# Patient Record
Sex: Female | Born: 1990 | Race: Black or African American | Hispanic: No | Marital: Single | Smoking: Never smoker
Health system: Southern US, Community
[De-identification: ages and names within clinical notes are randomized; demographics above are authoritative.]

## PROBLEM LIST (undated history)

## (undated) ENCOUNTER — Inpatient Hospital Stay (HOSPITAL_COMMUNITY): Payer: Self-pay

## (undated) DIAGNOSIS — B999 Unspecified infectious disease: Secondary | ICD-10-CM

## (undated) DIAGNOSIS — IMO0002 Reserved for concepts with insufficient information to code with codable children: Secondary | ICD-10-CM

## (undated) DIAGNOSIS — D219 Benign neoplasm of connective and other soft tissue, unspecified: Secondary | ICD-10-CM

## (undated) DIAGNOSIS — J45909 Unspecified asthma, uncomplicated: Secondary | ICD-10-CM

## (undated) DIAGNOSIS — R87619 Unspecified abnormal cytological findings in specimens from cervix uteri: Secondary | ICD-10-CM

## (undated) DIAGNOSIS — A539 Syphilis, unspecified: Secondary | ICD-10-CM

## (undated) DIAGNOSIS — D649 Anemia, unspecified: Secondary | ICD-10-CM

## (undated) DIAGNOSIS — A749 Chlamydial infection, unspecified: Secondary | ICD-10-CM

## (undated) DIAGNOSIS — B009 Herpesviral infection, unspecified: Secondary | ICD-10-CM

## (undated) HISTORY — DX: Herpesviral infection, unspecified: B00.9

## (undated) HISTORY — DX: Anemia, unspecified: D64.9

## (undated) HISTORY — DX: Benign neoplasm of connective and other soft tissue, unspecified: D21.9

## (undated) HISTORY — DX: Chlamydial infection, unspecified: A74.9

## (undated) HISTORY — DX: Syphilis, unspecified: A53.9

## (undated) HISTORY — PX: NO PAST SURGERIES: SHX2092

## (undated) SURGERY — Surgical Case
Anesthesia: *Unknown

---
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
## 2012-11-20 ENCOUNTER — Inpatient Hospital Stay (HOSPITAL_COMMUNITY)
Admission: AD | Admit: 2012-11-20 | Discharge: 2012-11-20 | Disposition: A | Discharge status: Home / Self Care | Payer: Medicaid Other | Source: Ambulatory Visit | Attending: Obstetrics & Gynecology | Admitting: Obstetrics & Gynecology

## 2012-11-20 ENCOUNTER — Encounter (HOSPITAL_COMMUNITY): Payer: Self-pay | Admitting: Medical

## 2012-11-20 DIAGNOSIS — Z3201 Encounter for pregnancy test, result positive: Secondary | ICD-10-CM | POA: Insufficient documentation

## 2012-11-20 DIAGNOSIS — N912 Amenorrhea, unspecified: Secondary | ICD-10-CM | POA: Insufficient documentation

## 2012-11-20 DIAGNOSIS — R109 Unspecified abdominal pain: Secondary | ICD-10-CM | POA: Insufficient documentation

## 2012-11-20 NOTE — MAU Provider Note (Signed)
Jane Rivers is a 22 y.o. G1P0 at [redacted]w[redacted]d who presents to MAU today with complaint of missed period. The patient states LMP 10/17/12. She denies abdominal pain, vaginal bleeding, discharge or other concerns at this time.   BP 118/71  Pulse 73  Temp(Src) 98.1 F (36.7 C) (Oral)  Resp 16  Ht 5\' 2"  (1.575 m)  Wt 164 lb 12.8 oz (74.753 kg)  BMI 30.13 kg/m2  SpO2 100%  LMP 10/17/2012 GENERAL: Well-developed, well-nourished female in no acute distress.  HEENT: Normocephalic, atraumatic.   LUNGS: Effort normal HEART: Regular rate  SKIN: Warm, dry and without erythema PSYCH: Normal mood and affect  Results for orders placed during the hospital encounter of 11/20/12 (from the past 24 hour(s))  POCT PREGNANCY, URINE     Status: Abnormal   Collection Time    11/20/12  3:35 PM      Result Value Range   Preg Test, Ur POSITIVE (*) NEGATIVE    A: Positive pregnancy test  P: Discharge home Pregnancy confirmation letter given with list of area providers and Medicaid information Bleeding/ectopic precautions discussed Patient may return to MAU as needed  Freddi Starr, PA-C 11/20/2012 3:42 PM

## 2012-11-20 NOTE — MAU Note (Signed)
Patient states she wants to have a pregnancy test because she has missed her period. Patient has not done a test at home. Denies any problems, pain, bleeding or discharge.

## 2012-11-20 NOTE — MAU Provider Note (Signed)
Attestation of Attending Supervision of Advanced Practitioner (CNM/NP): Evaluation and management procedures were performed by the Advanced Practitioner under my supervision and collaboration.  I have reviewed the Advanced Practitioner's note and chart, and I agree with the management and plan.  HARRAWAY-SMITH, Daisee Centner 5:38 PM     

## 2012-12-05 ENCOUNTER — Inpatient Hospital Stay (HOSPITAL_COMMUNITY): Payer: Medicaid Other

## 2012-12-05 ENCOUNTER — Encounter (HOSPITAL_COMMUNITY): Payer: Self-pay | Admitting: *Deleted

## 2012-12-05 ENCOUNTER — Inpatient Hospital Stay (HOSPITAL_COMMUNITY)
Admission: AD | Admit: 2012-12-05 | Discharge: 2012-12-05 | Disposition: A | Discharge status: Home / Self Care | Payer: Medicaid Other | Source: Ambulatory Visit | Attending: Family Medicine | Admitting: Family Medicine

## 2012-12-05 DIAGNOSIS — M549 Dorsalgia, unspecified: Secondary | ICD-10-CM | POA: Insufficient documentation

## 2012-12-05 DIAGNOSIS — O26899 Other specified pregnancy related conditions, unspecified trimester: Secondary | ICD-10-CM

## 2012-12-05 DIAGNOSIS — R109 Unspecified abdominal pain: Secondary | ICD-10-CM | POA: Insufficient documentation

## 2012-12-05 DIAGNOSIS — O99891 Other specified diseases and conditions complicating pregnancy: Secondary | ICD-10-CM | POA: Insufficient documentation

## 2012-12-05 HISTORY — DX: Unspecified abnormal cytological findings in specimens from cervix uteri: R87.619

## 2012-12-05 HISTORY — DX: Unspecified asthma, uncomplicated: J45.909

## 2012-12-05 HISTORY — DX: Reserved for concepts with insufficient information to code with codable children: IMO0002

## 2012-12-05 HISTORY — DX: Unspecified infectious disease: B99.9

## 2012-12-05 LAB — URINALYSIS, ROUTINE W REFLEX MICROSCOPIC
Bilirubin Urine: NEGATIVE
Protein, ur: NEGATIVE mg/dL
pH: 6 (ref 5.0–8.0)

## 2012-12-05 LAB — CBC
HCT: 38.4 % (ref 36.0–46.0)
Hemoglobin: 12.5 g/dL (ref 12.0–15.0)
MCH: 26.5 pg (ref 26.0–34.0)
MCHC: 32.6 g/dL (ref 30.0–36.0)
WBC: 9.6 10*3/uL (ref 4.0–10.5)

## 2012-12-05 LAB — HCG, QUANTITATIVE, PREGNANCY: hCG, Beta Chain, Quant, S: 91119 m[IU]/mL — ABNORMAL HIGH (ref <5)

## 2012-12-05 LAB — WET PREP, GENITAL: Trich, Wet Prep: NONE SEEN

## 2012-12-05 LAB — URINE MICROSCOPIC-ADD ON

## 2012-12-05 MED ORDER — PROMETHAZINE HCL 25 MG PO TABS: 25.0000 mg | ORAL_TABLET | Freq: Four times a day (QID) | ORAL | Status: DC | PRN

## 2012-12-05 NOTE — MAU Note (Signed)
abd and back pain, cramping. Started yesterday.

## 2012-12-05 NOTE — MAU Note (Signed)
Urine cloudy/murky

## 2012-12-05 NOTE — MAU Provider Note (Signed)
Chart reviewed and agree with management and plan.  

## 2012-12-05 NOTE — MAU Provider Note (Signed)
History     CSN: [id number]  Arrival date and time: 12/05/12 1216   First Provider Initiated Contact with Patient 12/05/12 1311      Chief Complaint  Patient presents with  . Abdominal Pain   HPI  Pt is [redacted]w[redacted]d pregnant and presents with abd pain that comes and goes.  Pt has nausea on and off.  Pt's pain is not associated with movement.  Pt denies spotting or bleeding or UTI symptoms.  Pt does state that she has a vaginal discharge. Pt has never had a pelvic exam before.   Past Medical History  Diagnosis Date  . Asthma   . Abnormal Pap smear     never had  . Infection     chlamydia    Past Surgical History  Procedure Laterality Date  . No past surgeries      Family History  Problem Relation Age of Onset  . Diabetes Maternal Aunt     History  Substance Use Topics  . Smoking status: Never Smoker   . Smokeless tobacco: Never Used  . Alcohol Use: No    Allergies:  Allergies  Allergen Reactions  . Shellfish Allergy Nausea And Vomiting    Prescriptions prior to admission  Medication Sig Dispense Refill  . Prenatal Vit-Fe Fumarate-FA (PRENATAL MULTIVITAMIN) TABS Take 1 tablet by mouth daily at 12 noon.        ROS Physical Exam   Blood pressure 121/67, pulse 77, temperature 97.2 F (36.2 C), temperature source Oral, resp. rate 18, height 5\' 1"  (1.549 m), weight 76.204 kg (168 lb), last menstrual period 10/17/2012.  Physical Exam  Nursing note and vitals reviewed. Constitutional: She is oriented to person, place, and time. She appears well-developed and well-nourished.  HENT:  Head: Normocephalic.  Eyes: Pupils are equal, round, and reactive to light.  Neck: Normal range of motion. Neck supple.  Cardiovascular: Normal rate.   Respiratory: Effort normal.  GI: Soft. She exhibits no distension. There is no tenderness. There is no rebound.  Genitourinary:  Reddened vaginal mucosa with mod amount of white-cream colored frothy dscharge; cervix clean; uterus NSSC-  6-8 week size; NT; adnexa without palpable enlargement or tenderness  Musculoskeletal: Normal range of motion.  Neurological: She is alert and oriented to person, place, and time.  Skin: Skin is warm and dry.  Psychiatric: She has a normal mood and affect.    MAU Course  Procedures Results for orders placed during the hospital encounter of 12/05/12 (from the past 24 hour(s))  CBC     Status: None   Collection Time    12/05/12 12:32 PM      Result Value Range   WBC 9.6  4.0 - 10.5 K/uL   RBC 4.72  3.87 - 5.11 MIL/uL   Hemoglobin 12.5  12.0 - 15.0 g/dL   HCT 11.9  14.7 - 82.9 %   MCV 81.4  78.0 - 100.0 fL   MCH 26.5  26.0 - 34.0 pg   MCHC 32.6  30.0 - 36.0 g/dL   RDW 56.2  13.0 - 86.5 %   Platelets 216  150 - 400 K/uL  HCG, QUANTITATIVE, PREGNANCY     Status: Abnormal   Collection Time    12/05/12 12:32 PM      Result Value Range   hCG, Beta Chain, Quant, S 91119 (*) <5 mIU/mL  URINALYSIS, ROUTINE W REFLEX MICROSCOPIC     Status: Abnormal   Collection Time    12/05/12 12:33 PM  Result Value Range   Color, Urine YELLOW  YELLOW   APPearance CLOUDY (*) CLEAR   Specific Gravity, Urine 1.025  1.005 - 1.030   pH 6.0  5.0 - 8.0   Glucose, UA NEGATIVE  NEGATIVE mg/dL   Hgb urine dipstick TRACE (*) NEGATIVE   Bilirubin Urine NEGATIVE  NEGATIVE   Ketones, ur NEGATIVE  NEGATIVE mg/dL   Protein, ur NEGATIVE  NEGATIVE mg/dL   Urobilinogen, UA 0.2  0.0 - 1.0 mg/dL   Nitrite NEGATIVE  NEGATIVE   Leukocytes, UA LARGE (*) NEGATIVE  URINE MICROSCOPIC-ADD ON     Status: Abnormal   Collection Time    12/05/12 12:33 PM      Result Value Range   Squamous Epithelial / LPF MANY (*) RARE   WBC, UA 21-50  <3 WBC/hpf   Bacteria, UA FEW (*) RARE  WET PREP, GENITAL     Status: Abnormal   Collection Time    12/05/12  1:21 PM      Result Value Range   Yeast Wet Prep HPF POC NONE SEEN  NONE SEEN   Trich, Wet Prep NONE SEEN  NONE SEEN   Clue Cells Wet Prep HPF POC NONE SEEN  NONE SEEN    WBC, Wet Prep HPF POC FEW (*) NONE SEEN    Assessment and Plan  Viable 7w IUP F/u with Prenatal care- pt plans to go to [facility] 12/05/2012, 2:24 PM

## 2012-12-06 LAB — URINE CULTURE: Colony Count: 80000

## 2012-12-06 LAB — GC/CHLAMYDIA PROBE AMP: GC Probe RNA: NEGATIVE

## 2013-01-29 ENCOUNTER — Encounter: Payer: Self-pay | Admitting: Obstetrics & Gynecology

## 2013-04-29 ENCOUNTER — Inpatient Hospital Stay (HOSPITAL_COMMUNITY)
Admission: AD | Admit: 2013-04-29 | Discharge: 2013-04-29 | Disposition: A | Discharge status: Home / Self Care | Payer: Medicaid Other | Source: Ambulatory Visit | Attending: Obstetrics and Gynecology | Admitting: Obstetrics and Gynecology

## 2013-04-29 ENCOUNTER — Encounter (HOSPITAL_COMMUNITY): Payer: Self-pay

## 2013-04-29 ENCOUNTER — Inpatient Hospital Stay (HOSPITAL_COMMUNITY): Payer: Medicaid Other

## 2013-04-29 DIAGNOSIS — O99891 Other specified diseases and conditions complicating pregnancy: Secondary | ICD-10-CM | POA: Insufficient documentation

## 2013-04-29 DIAGNOSIS — R109 Unspecified abdominal pain: Secondary | ICD-10-CM | POA: Insufficient documentation

## 2013-04-29 DIAGNOSIS — Z22330 Carrier of Group B streptococcus: Secondary | ICD-10-CM | POA: Insufficient documentation

## 2013-04-29 DIAGNOSIS — O4700 False labor before 37 completed weeks of gestation, unspecified trimester: Secondary | ICD-10-CM | POA: Insufficient documentation

## 2013-04-29 DIAGNOSIS — R079 Chest pain, unspecified: Secondary | ICD-10-CM | POA: Insufficient documentation

## 2013-04-29 LAB — CBC WITH DIFFERENTIAL/PLATELET
Basophils Absolute: 0 10*3/uL (ref 0.0–0.1)
Basophils Relative: 0 % (ref 0–1)
Eosinophils Absolute: 0.3 10*3/uL (ref 0.0–0.7)
Eosinophils Relative: 4 % (ref 0–5)
MCH: 26.7 pg (ref 26.0–34.0)
MCHC: 32.9 g/dL (ref 30.0–36.0)
Monocytes Absolute: 0.6 10*3/uL (ref 0.1–1.0)
Monocytes Relative: 8 % (ref 3–12)
Neutrophils Relative %: 71 % (ref 43–77)
Platelets: 172 10*3/uL (ref 150–400)
RDW: 14.5 % (ref 11.5–15.5)
WBC: 8.4 10*3/uL (ref 4.0–10.5)

## 2013-04-29 LAB — WET PREP, GENITAL
Clue Cells Wet Prep HPF POC: NONE SEEN
Trich, Wet Prep: NONE SEEN
Yeast Wet Prep HPF POC: NONE SEEN

## 2013-04-29 LAB — BASIC METABOLIC PANEL
BUN: 6 mg/dL (ref 6–23)
Calcium: 9.3 mg/dL (ref 8.4–10.5)
GFR calc Af Amer: >90 mL/min (ref >90)
GFR calc non Af Amer: >90 mL/min (ref >90)
Potassium: 3.8 mEq/L (ref 3.5–5.1)
Sodium: 131 mEq/L — ABNORMAL LOW (ref 135–145)

## 2013-04-29 LAB — URINALYSIS, ROUTINE W REFLEX MICROSCOPIC
Bilirubin Urine: NEGATIVE
Glucose, UA: NEGATIVE mg/dL
Ketones, ur: NEGATIVE mg/dL
Nitrite: NEGATIVE
Protein, ur: NEGATIVE mg/dL
Specific Gravity, Urine: >1.03 — ABNORMAL HIGH (ref 1.005–1.030)
Urobilinogen, UA: 0.2 mg/dL (ref 0.0–1.0)

## 2013-04-29 LAB — FETAL FIBRONECTIN: Fetal Fibronectin: NEGATIVE

## 2013-04-29 MED ORDER — NIFEDIPINE 10 MG PO CAPS: 10.0000 mg | ORAL_CAPSULE | ORAL | Status: DC | PRN

## 2013-04-29 MED ORDER — NIFEDIPINE 10 MG PO CAPS
10.0000 mg | ORAL_CAPSULE | ORAL | Status: DC | PRN
  Administered 2013-04-29: 10 mg via ORAL
  Filled 2013-04-29: qty 1
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
## 2013-04-29 NOTE — MAU Note (Signed)
Pain x 1 month 

## 2013-04-30 LAB — GC/CHLAMYDIA PROBE AMP: GC Probe RNA: NEGATIVE

## 2013-07-23 ENCOUNTER — Encounter (HOSPITAL_COMMUNITY)
Admission: AD | Disposition: A | Discharge status: Home / Self Care | Payer: Self-pay | Source: Ambulatory Visit | Attending: Obstetrics and Gynecology

## 2013-07-23 ENCOUNTER — Inpatient Hospital Stay (HOSPITAL_COMMUNITY)
Admission: AD | Admit: 2013-07-23 | Discharge: 2013-07-26 | DRG: 766 | Disposition: A | Discharge status: Home / Self Care | Payer: Medicaid Other | Source: Ambulatory Visit | Attending: Obstetrics and Gynecology | Admitting: Obstetrics and Gynecology

## 2013-07-23 ENCOUNTER — Encounter (HOSPITAL_COMMUNITY): Payer: Medicaid Other | Admitting: Anesthesiology

## 2013-07-23 ENCOUNTER — Inpatient Hospital Stay (HOSPITAL_COMMUNITY): Payer: Medicaid Other | Admitting: Anesthesiology

## 2013-07-23 ENCOUNTER — Encounter (HOSPITAL_COMMUNITY): Payer: Self-pay | Admitting: *Deleted

## 2013-07-23 DIAGNOSIS — O9989 Other specified diseases and conditions complicating pregnancy, childbirth and the puerperium: Secondary | ICD-10-CM

## 2013-07-23 DIAGNOSIS — Z98891 History of uterine scar from previous surgery: Secondary | ICD-10-CM

## 2013-07-23 DIAGNOSIS — O98519 Other viral diseases complicating pregnancy, unspecified trimester: Principal | ICD-10-CM | POA: Diagnosis present

## 2013-07-23 DIAGNOSIS — O99892 Other specified diseases and conditions complicating childbirth: Secondary | ICD-10-CM | POA: Diagnosis present

## 2013-07-23 DIAGNOSIS — O4290 Premature rupture of membranes, unspecified as to length of time between rupture and onset of labor, unspecified weeks of gestation: Secondary | ICD-10-CM | POA: Diagnosis present

## 2013-07-23 DIAGNOSIS — O99344 Other mental disorders complicating childbirth: Secondary | ICD-10-CM | POA: Diagnosis present

## 2013-07-23 DIAGNOSIS — Z22330 Carrier of Group B streptococcus: Secondary | ICD-10-CM

## 2013-07-23 DIAGNOSIS — A6000 Herpesviral infection of urogenital system, unspecified: Secondary | ICD-10-CM | POA: Diagnosis present

## 2013-07-23 DIAGNOSIS — F1210 Cannabis abuse, uncomplicated: Secondary | ICD-10-CM | POA: Diagnosis present

## 2013-07-23 LAB — CBC
HCT: 38.5 % (ref 36.0–46.0)
HEMOGLOBIN: 13.1 g/dL (ref 12.0–15.0)
MCH: 27.5 pg (ref 26.0–34.0)
MCHC: 34 g/dL (ref 30.0–36.0)
MCV: 80.7 fL (ref 78.0–100.0)
Platelets: 132 10*3/uL — ABNORMAL LOW (ref 150–400)
RBC: 4.77 MIL/uL (ref 3.87–5.11)
RDW: 14.2 % (ref 11.5–15.5)
WBC: 6.2 10*3/uL (ref 4.0–10.5)

## 2013-07-23 LAB — AMNISURE RUPTURE OF MEMBRANE (ROM) NOT AT ARMC: Amnisure ROM: POSITIVE

## 2013-07-23 LAB — ABO/RH: ABO/RH(D): O POS

## 2013-07-23 LAB — TYPE AND SCREEN
ABO/RH(D): O POS
Antibody Screen: NEGATIVE

## 2013-07-23 LAB — POCT FERN TEST: POCT FERN TEST: NEGATIVE

## 2013-07-23 SURGERY — Surgical Case
Anesthesia: Spinal | Site: Abdomen

## 2013-07-23 MED ORDER — HYDROMORPHONE HCL PF 1 MG/ML IJ SOLN: 0.2500 mg | INTRAMUSCULAR | Status: DC | PRN

## 2013-07-23 MED ORDER — SODIUM CHLORIDE 0.9 % IR SOLN
Status: DC | PRN
  Administered 2013-07-23: 1000 mL

## 2013-07-23 MED ORDER — PHENYLEPHRINE 8 MG IN D5W 100 ML (0.08MG/ML) PREMIX OPTIME
INJECTION | INTRAVENOUS | Status: DC | PRN
  Administered 2013-07-23: 60 ug/min via INTRAVENOUS

## 2013-07-23 MED ORDER — KETOROLAC TROMETHAMINE 30 MG/ML IJ SOLN
INTRAMUSCULAR | Status: AC
  Administered 2013-07-23: 30 mg via INTRAVENOUS
  Filled 2013-07-23: qty 1

## 2013-07-23 MED ORDER — PRENATAL MULTIVITAMIN CH
1.0000 | ORAL_TABLET | Freq: Every day | ORAL | Status: DC
  Administered 2013-07-24 – 2013-07-25 (×2): 1 via ORAL
  Filled 2013-07-23 (×2): qty 1

## 2013-07-23 MED ORDER — FENTANYL CITRATE 0.05 MG/ML IJ SOLN
INTRAMUSCULAR | Status: DC | PRN
  Administered 2013-07-23: 25 ug via INTRATHECAL

## 2013-07-23 MED ORDER — DIBUCAINE 1 % RE OINT: 1.0000 "application " | TOPICAL_OINTMENT | RECTAL | Status: DC | PRN

## 2013-07-23 MED ORDER — OXYCODONE-ACETAMINOPHEN 5-325 MG PO TABS
1.0000 | ORAL_TABLET | ORAL | Status: DC | PRN
  Administered 2013-07-24 (×2): 1 via ORAL
  Administered 2013-07-25: 2 via ORAL
  Administered 2013-07-25: 1 via ORAL
  Filled 2013-07-23 (×2): qty 1
  Filled 2013-07-23: qty 2
  Filled 2013-07-23: qty 1
  Filled 2013-07-23: qty 2

## 2013-07-23 MED ORDER — NALOXONE HCL 0.4 MG/ML IJ SOLN
0.4000 mg | INTRAMUSCULAR | Status: DC | PRN
Start: 2013-07-23 — End: 2013-07-23

## 2013-07-23 MED ORDER — KETOROLAC TROMETHAMINE 30 MG/ML IJ SOLN
30.0000 mg | Freq: Four times a day (QID) | INTRAMUSCULAR | Status: DC | PRN
Start: 2013-07-23 — End: 2013-07-23
  Administered 2013-07-23: 30 mg via INTRAVENOUS

## 2013-07-23 MED ORDER — CEFAZOLIN SODIUM-DEXTROSE 2-3 GM-% IV SOLR
INTRAVENOUS | Status: DC | PRN
  Administered 2013-07-23: 2 g via INTRAVENOUS

## 2013-07-23 MED ORDER — LACTATED RINGERS IV SOLN
INTRAVENOUS | Status: DC | PRN
  Administered 2013-07-23 (×2): via INTRAVENOUS

## 2013-07-23 MED ORDER — FENTANYL CITRATE 0.05 MG/ML IJ SOLN
INTRAMUSCULAR | Status: AC
  Filled 2013-07-23: qty 2

## 2013-07-23 MED ORDER — SENNOSIDES-DOCUSATE SODIUM 8.6-50 MG PO TABS
2.0000 | ORAL_TABLET | ORAL | Status: DC
  Administered 2013-07-24 – 2013-07-25 (×2): 2 via ORAL
  Filled 2013-07-23 (×2): qty 2

## 2013-07-23 MED ORDER — ONDANSETRON HCL 4 MG/2ML IJ SOLN
4.0000 mg | INTRAMUSCULAR | Status: DC | PRN
Start: 2013-07-23 — End: 2013-07-26

## 2013-07-23 MED ORDER — SCOPOLAMINE 1 MG/3DAYS TD PT72
MEDICATED_PATCH | TRANSDERMAL | Status: AC
  Filled 2013-07-23: qty 1

## 2013-07-23 MED ORDER — SIMETHICONE 80 MG PO CHEW
80.0000 mg | CHEWABLE_TABLET | ORAL | Status: DC | PRN
  Administered 2013-07-24: 80 mg via ORAL

## 2013-07-23 MED ORDER — NALBUPHINE HCL 10 MG/ML IJ SOLN
5.0000 mg | INTRAMUSCULAR | Status: DC | PRN
Start: 2013-07-23 — End: 2013-07-23

## 2013-07-23 MED ORDER — LANOLIN HYDROUS EX OINT: 1.0000 "application " | TOPICAL_OINTMENT | CUTANEOUS | Status: DC | PRN

## 2013-07-23 MED ORDER — METOCLOPRAMIDE HCL 5 MG/ML IJ SOLN
10.0000 mg | Freq: Three times a day (TID) | INTRAMUSCULAR | Status: DC | PRN
Start: 2013-07-23 — End: 2013-07-23

## 2013-07-23 MED ORDER — IBUPROFEN 600 MG PO TABS
600.0000 mg | ORAL_TABLET | Freq: Four times a day (QID) | ORAL | Status: DC
  Administered 2013-07-23 – 2013-07-26 (×9): 600 mg via ORAL
  Filled 2013-07-23 (×10): qty 1

## 2013-07-23 MED ORDER — DIPHENHYDRAMINE HCL 25 MG PO CAPS: 25.0000 mg | ORAL_CAPSULE | Freq: Four times a day (QID) | ORAL | Status: DC | PRN

## 2013-07-23 MED ORDER — LACTATED RINGERS IV BOLUS (SEPSIS): 1000.0000 mL | Freq: Once | INTRAVENOUS | Status: DC

## 2013-07-23 MED ORDER — DIPHENHYDRAMINE HCL 50 MG/ML IJ SOLN: 25.0000 mg | INTRAMUSCULAR | Status: DC | PRN

## 2013-07-23 MED ORDER — MENTHOL 3 MG MT LOZG: 1.0000 | LOZENGE | OROMUCOSAL | Status: DC | PRN

## 2013-07-23 MED ORDER — NALBUPHINE HCL 10 MG/ML IJ SOLN: 5.0000 mg | INTRAMUSCULAR | Status: DC | PRN

## 2013-07-23 MED ORDER — OXYTOCIN 40 UNITS IN LACTATED RINGERS INFUSION - SIMPLE MED: 62.5000 mL/h | INTRAVENOUS | Status: AC

## 2013-07-23 MED ORDER — FAMOTIDINE IN NACL 20-0.9 MG/50ML-% IV SOLN
20.0000 mg | Freq: Once | INTRAVENOUS | Status: AC
  Administered 2013-07-23: 20 mg via INTRAVENOUS
  Filled 2013-07-23: qty 50

## 2013-07-23 MED ORDER — ONDANSETRON HCL 4 MG/2ML IJ SOLN
INTRAMUSCULAR | Status: DC | PRN
Start: 2013-07-23 — End: 2013-07-23
  Administered 2013-07-23: 4 mg via INTRAVENOUS

## 2013-07-23 MED ORDER — SIMETHICONE 80 MG PO CHEW
80.0000 mg | CHEWABLE_TABLET | ORAL | Status: DC
  Administered 2013-07-24 – 2013-07-25 (×2): 80 mg via ORAL
  Filled 2013-07-23 (×2): qty 1

## 2013-07-23 MED ORDER — SODIUM CHLORIDE 0.9 % IJ SOLN: 3.0000 mL | INTRAMUSCULAR | Status: DC | PRN

## 2013-07-23 MED ORDER — CEFAZOLIN SODIUM-DEXTROSE 2-3 GM-% IV SOLR
2.0000 g | Freq: Once | INTRAVENOUS | Status: AC
  Administered 2013-07-23: 2 g via INTRAVENOUS
  Filled 2013-07-23: qty 50

## 2013-07-23 MED ORDER — DIPHENHYDRAMINE HCL 50 MG/ML IJ SOLN
12.5000 mg | INTRAMUSCULAR | Status: DC | PRN
Start: 2013-07-23 — End: 2013-07-23

## 2013-07-23 MED ORDER — ZOLPIDEM TARTRATE 5 MG PO TABS: 5.0000 mg | ORAL_TABLET | Freq: Every evening | ORAL | Status: DC | PRN

## 2013-07-23 MED ORDER — MEPERIDINE HCL 25 MG/ML IJ SOLN: 6.2500 mg | INTRAMUSCULAR | Status: DC | PRN

## 2013-07-23 MED ORDER — CITRIC ACID-SODIUM CITRATE 334-500 MG/5ML PO SOLN
30.0000 mL | Freq: Once | ORAL | Status: AC
  Administered 2013-07-23: 30 mL via ORAL
  Filled 2013-07-23: qty 15

## 2013-07-23 MED ORDER — ONDANSETRON HCL 4 MG/2ML IJ SOLN
INTRAMUSCULAR | Status: AC
  Filled 2013-07-23: qty 2

## 2013-07-23 MED ORDER — MORPHINE SULFATE (PF) 0.5 MG/ML IJ SOLN
INTRAMUSCULAR | Status: DC | PRN
  Administered 2013-07-23: .15 mg via INTRATHECAL

## 2013-07-23 MED ORDER — MORPHINE SULFATE 0.5 MG/ML IJ SOLN
INTRAMUSCULAR | Status: AC
  Filled 2013-07-23: qty 10

## 2013-07-23 MED ORDER — TETANUS-DIPHTH-ACELL PERTUSSIS 5-2.5-18.5 LF-MCG/0.5 IM SUSP: 0.5000 mL | Freq: Once | INTRAMUSCULAR | Status: DC

## 2013-07-23 MED ORDER — OXYTOCIN 10 UNIT/ML IJ SOLN
40.0000 [IU] | INTRAVENOUS | Status: DC | PRN
  Administered 2013-07-23: 40 [IU] via INTRAVENOUS

## 2013-07-23 MED ORDER — ONDANSETRON HCL 4 MG PO TABS: 4.0000 mg | ORAL_TABLET | ORAL | Status: DC | PRN

## 2013-07-23 MED ORDER — ONDANSETRON HCL 4 MG/2ML IJ SOLN: 4.0000 mg | Freq: Three times a day (TID) | INTRAMUSCULAR | Status: DC | PRN

## 2013-07-23 MED ORDER — WITCH HAZEL-GLYCERIN EX PADS: 1.0000 "application " | MEDICATED_PAD | CUTANEOUS | Status: DC | PRN

## 2013-07-23 MED ORDER — KETOROLAC TROMETHAMINE 30 MG/ML IJ SOLN: 30.0000 mg | Freq: Four times a day (QID) | INTRAMUSCULAR | Status: DC | PRN

## 2013-07-23 MED ORDER — SIMETHICONE 80 MG PO CHEW
80.0000 mg | CHEWABLE_TABLET | Freq: Three times a day (TID) | ORAL | Status: DC
  Administered 2013-07-23 – 2013-07-26 (×6): 80 mg via ORAL
  Filled 2013-07-23 (×7): qty 1

## 2013-07-23 MED ORDER — OXYTOCIN 10 UNIT/ML IJ SOLN
INTRAMUSCULAR | Status: AC
  Filled 2013-07-23: qty 4

## 2013-07-23 MED ORDER — LACTATED RINGERS IV SOLN
INTRAVENOUS | Status: DC
  Administered 2013-07-24: 01:00:00 via INTRAVENOUS

## 2013-07-23 MED ORDER — AZITHROMYCIN 1 G PO PACK
1.0000 g | PACK | Freq: Once | ORAL | Status: AC
  Administered 2013-07-23: 1 g via ORAL
  Filled 2013-07-23: qty 1

## 2013-07-23 MED ORDER — DIPHENHYDRAMINE HCL 25 MG PO CAPS
25.0000 mg | ORAL_CAPSULE | ORAL | Status: DC | PRN
  Filled 2013-07-23: qty 1

## 2013-07-23 MED ORDER — NALOXONE HCL 1 MG/ML IJ SOLN
1.0000 ug/kg/h | INTRAVENOUS | Status: DC | PRN
  Filled 2013-07-23: qty 2

## 2013-07-23 MED ORDER — PHENYLEPHRINE HCL 10 MG/ML IJ SOLN
INTRAMUSCULAR | Status: AC
  Filled 2013-07-23: qty 1

## 2013-07-23 MED ORDER — SCOPOLAMINE 1 MG/3DAYS TD PT72
1.0000 | MEDICATED_PATCH | Freq: Once | TRANSDERMAL | Status: DC
  Administered 2013-07-23: 1.5 mg via TRANSDERMAL

## 2013-07-23 SURGICAL SUPPLY — 37 items
APL SKNCLS STERI-STRIP NONHPOA (GAUZE/BANDAGES/DRESSINGS) ×1
BENZOIN TINCTURE PRP APPL 2/3 (GAUZE/BANDAGES/DRESSINGS) ×2 IMPLANT
CLAMP CORD UMBIL (MISCELLANEOUS) IMPLANT
CLOTH BEACON ORANGE TIMEOUT ST (SAFETY) ×2 IMPLANT
CONTAINER PREFILL 10% NBF 15ML (MISCELLANEOUS) IMPLANT
DRAPE LG THREE QUARTER DISP (DRAPES) ×2 IMPLANT
DRSG OPSITE POSTOP 4X10 (GAUZE/BANDAGES/DRESSINGS) ×2 IMPLANT
DURAPREP 26ML APPLICATOR (WOUND CARE) ×2 IMPLANT
ELECT REM PT RETURN 9FT ADLT (ELECTROSURGICAL) ×2
ELECTRODE REM PT RTRN 9FT ADLT (ELECTROSURGICAL) ×1 IMPLANT
EXTRACTOR VACUUM M CUP 4 TUBE (SUCTIONS) IMPLANT
GLOVE BIO SURGEON STRL SZ7.5 (GLOVE) ×2 IMPLANT
GLOVE BIOGEL PI IND STRL 7.0 (GLOVE) ×2 IMPLANT
GLOVE BIOGEL PI IND STRL 7.5 (GLOVE) ×1 IMPLANT
GLOVE BIOGEL PI INDICATOR 7.0 (GLOVE) ×2
GLOVE BIOGEL PI INDICATOR 7.5 (GLOVE) ×1
GOWN STRL REUS W/ TWL XL LVL3 (GOWN DISPOSABLE) ×1 IMPLANT
GOWN STRL REUS W/TWL LRG LVL3 (GOWN DISPOSABLE) ×2 IMPLANT
GOWN STRL REUS W/TWL XL LVL3 (GOWN DISPOSABLE) ×2
KIT ABG SYR 3ML LUER SLIP (SYRINGE) IMPLANT
NEEDLE HYPO 25X5/8 SAFETYGLIDE (NEEDLE) IMPLANT
NS IRRIG 1000ML POUR BTL (IV SOLUTION) ×2 IMPLANT
PACK C SECTION WH (CUSTOM PROCEDURE TRAY) ×2 IMPLANT
PAD OB MATERNITY 4.3X12.25 (PERSONAL CARE ITEMS) ×2 IMPLANT
RETRACTOR WND ALEXIS 25 LRG (MISCELLANEOUS) ×1 IMPLANT
RTRCTR WOUND ALEXIS 25CM LRG (MISCELLANEOUS) ×2
STRIP CLOSURE SKIN 1/2X4 (GAUZE/BANDAGES/DRESSINGS) ×2 IMPLANT
SUT CHROMIC 2 0 CT 1 (SUTURE) ×2 IMPLANT
SUT MNCRL AB 3-0 PS2 27 (SUTURE) ×2 IMPLANT
SUT PLAIN 0 NONE (SUTURE) IMPLANT
SUT PLAIN 2 0 XLH (SUTURE) ×2 IMPLANT
SUT VIC AB 0 CT1 36 (SUTURE) ×2 IMPLANT
SUT VIC AB 0 CTX 36 (SUTURE) ×6
SUT VIC AB 0 CTX36XBRD ANBCTRL (SUTURE) ×3 IMPLANT
TOWEL OR 17X24 6PK STRL BLUE (TOWEL DISPOSABLE) ×4 IMPLANT
TRAY FOLEY CATH 14FR (SET/KITS/TRAYS/PACK) ×2 IMPLANT
WATER STERILE IRR 1000ML POUR (IV SOLUTION) ×1 IMPLANT

## 2013-07-23 NOTE — Anesthesia Postprocedure Evaluation (Signed)
  Anesthesia Post-op Note  Patient: Jane Rivers  Procedure(s) Performed: Procedure(s): CESAREAN SECTION (N/A)  Patient is awake, responsive, moving her legs, and has signs of resolution of her numbness. Pain and nausea are reasonably well controlled. Vital signs are stable and clinically acceptable. Oxygen saturation is clinically acceptable. There are no apparent anesthetic complications at this time. Patient is ready for discharge.

## 2013-07-23 NOTE — Anesthesia Postprocedure Evaluation (Deleted)
  Anesthesia Post-op Note  Patient: Jane Rivers  Procedure(s) Performed: Procedure(s): CESAREAN SECTION (N/A)  Patient Location: PACU  Anesthesia Type:Spinal  Level of Consciousness: awake, alert  and oriented  Airway and Oxygen Therapy: Patient Spontanous Breathing  Post-op Pain: none  Post-op Assessment: Post-op Vital signs reviewed, Patient's Cardiovascular Status Stable and Pain level controlled  Post-op Vital Signs: Reviewed and stable  Complications: No apparent anesthesia complications

## 2013-07-23 NOTE — Anesthesia Postprocedure Evaluation (Signed)
  Anesthesia Post-op Note  Patient: Jane Rivers  Procedure(s) Performed: Procedure(s): CESAREAN SECTION (N/A)  Patient is awake, responsive, moving her legs, and has signs of resolution of her numbness. Pain and nausea are reasonably well controlled. Vital signs are stable and clinically acceptable. Oxygen saturation is clinically acceptable. There are no apparent anesthetic complications at this time. Patient is ready for discharge.  

## 2013-07-23 NOTE — Transfer of Care (Signed)
Immediate Anesthesia Transfer of Care Note  Patient: Jane Rivers  Procedure(s) Performed: Procedure(s): CESAREAN SECTION (N/A)  Patient Location: PACU  Anesthesia Type:Spinal  Level of Consciousness: awake, alert  and oriented  Airway & Oxygen Therapy: Patient Spontanous Breathing  Post-op Assessment: Report given to PACU RN and Post -op Vital signs reviewed and stable  Post vital signs: Reviewed and stable  Complications: No apparent anesthesia complications

## 2013-07-23 NOTE — Anesthesia Preprocedure Evaluation (Signed)
Anesthesia Evaluation  Patient identified by MRN, date of birth, ID band Patient awake    Reviewed: Allergy & Precautions, H&P , Patient's Chart, lab work & pertinent test results  Airway Mallampati: II TM Distance: >3 FB Neck ROM: full    Dental no notable dental hx.    Pulmonary  breath sounds clear to auscultation  Pulmonary exam normal       Cardiovascular Exercise Tolerance: Good Rhythm:regular Rate:Normal     Neuro/Psych    GI/Hepatic   Endo/Other    Renal/GU      Musculoskeletal   Abdominal   Peds  Hematology   Anesthesia Other Findings   Reproductive/Obstetrics                           Anesthesia Physical Anesthesia Plan  ASA: II and emergent  Anesthesia Plan: Spinal   Post-op Pain Management:    Induction:   Airway Management Planned:   Additional Equipment:   Intra-op Plan:   Post-operative Plan:   Informed Consent: I have reviewed the patients History and Physical, chart, labs and discussed the procedure including the risks, benefits and alternatives for the proposed anesthesia with the patient or authorized representative who has indicated his/her understanding and acceptance.   Dental Advisory Given  Plan Discussed with: CRNA  Anesthesia Plan Comments: (Lab work confirmed with CRNA in room. Platelets okay. Discussed spinal anesthetic, and patient consents to the procedure:  included risk of possible headache,backache, failed block, allergic reaction, and nerve injury. This patient was asked if she had any questions or concerns before the procedure started. )        Anesthesia Quick Evaluation

## 2013-07-23 NOTE — Anesthesia Postprocedure Evaluation (Signed)
  Anesthesia Post-op Note  Patient: Jane Rivers  Procedure(s) Performed: Procedure(s): CESAREAN SECTION (N/A)  Patient Location: Mother/Baby  Anesthesia Type:Spinal  Level of Consciousness: awake, alert , oriented and patient cooperative  Airway and Oxygen Therapy: Patient Spontanous Breathing  Post-op Pain: mild  Post-op Assessment: Patient's Cardiovascular Status Stable, Respiratory Function Stable, Patent Airway, No signs of Nausea or vomiting, Adequate PO intake and Pain level controlled  Post-op Vital Signs: Reviewed and stable  Complications: No apparent anesthesia complications

## 2013-07-23 NOTE — MAU Note (Signed)
Pt presents with complaints of a large gush of fluids around 620 this morning, denies any bleeding.

## 2013-07-23 NOTE — Lactation Note (Signed)
This note was copied from the chart of Jane Kehinde Trautner. Lactation Consultation Note  Patient Name: Jane Rivers Date: 07/23/2013 Reason for consult: Initial assessment;Other (Comment) (charting for exclusion)   Maternal Data Formula Feeding for Exclusion: Yes Reason for exclusion: Mother's choice to formula feed on admision  Feeding Feeding Type: Formula Nipple Type: Regular  LATCH Score/Interventions                      Lactation Tools Discussed/Used     Consult Status Consult Status: Complete    Bernita Buffy 07/23/2013, 3:36 PM

## 2013-07-23 NOTE — H&P (Signed)
Jane Rivers is a 23 y.o. female presenting for c/o gush of fluid this morning.  Pt denies ctxs, reports good fm and denied vb.  Pt had + CT in 11/14 and recently dx'd again 07/04/13.  Pt says she was tx'd but bc she is a poor historian and FOB not treated, we have opted to tx again.  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  The pt denies any sxs of burning or pain now or even at the time lesion was cultured but she is certain that it is the first time she had a lesion. The pt has been taking valtrex qd.  History OB History   Grav Para Term Preterm Abortions TAB SAB Ect Mult Living   1         0     Past Medical History  Diagnosis Date  . Asthma   . Abnormal Pap smear     never had  . Infection     chlamydia   Past Surgical History  Procedure Laterality Date  . No past surgeries     Family History: family history includes Hypertension in her maternal aunt, maternal grandfather, and maternal grandmother. Social History:  reports that she has never smoked. She has never used smokeless tobacco. She reports that she uses illicit drugs (Marijuana). She reports that she does not drink alcohol.   Prenatal Transfer Tool  Maternal Diabetes: No Genetic Screening: Normal Maternal Ultrasounds/Referrals: Normal Fetal Ultrasounds or other Referrals:  None Maternal Substance Abuse:  No Significant Maternal Medications:  Meds include: Other: Zithromax, Valtrex Significant Maternal Lab Results:  Lab values include: Group B Strep positive Other Comments:  Chlamydia 07/04/2013 recently treated, no TOC available  ROS Non-contributory  Dilation: Closed Effacement (%): Thick Station: -3 Exam by:: A Naziah Portee  Blood pressure 108/57, pulse 107, temperature 98.2 F (36.8 C), temperature source Oral, resp. rate 18, height 5\' 4"  (1.626 m), weight 87.726 kg (193 lb 6.4 oz), last menstrual period 10/17/2012. Exam Physical Exam   Lungs CTA CV RRR Abd gravid, NT Ext no  calf tenderness Spec no pooling, small amount of thin white discharge, no obvious external or internal vaginal lesions for HSV.  Area near labia appears healed but outline of lesion is still apparent. VE as above FHT cat 1  Prenatal labs: ABO, Rh:   Antibody:   Rubella:   RPR:    HBsAg:    HIV:    GBS:     Assessment/Plan: P0 at 39 6/7 wks with PROM.  Pt is not in labor.  Given the pts ruptured status and known recent HSV outbreak and by pt report a first time lesion, I have discussed the r/b/a of vaginal delivery vs c-section.  My intuition says this may have been a recurrent lesion simply because pt does not report sxs at time or outbreak or now but because she is certain this is the first time she has known of a lesion being there, I have counseled her about the risks or recent first time lesion in addition to recurrence.  After consideration and discussion with her mother, she has decided to proceed with primary c-section.  Fetal status is overall reassuring.  Ancef 2g.  Consent signed and witnessed.  Yoel Kaufhold Y 07/23/2013, 11:29 AM

## 2013-07-23 NOTE — Anesthesia Procedure Notes (Signed)

## 2013-07-23 NOTE — Op Note (Signed)
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 PROM 2.HSV Outbreak 3.Term Preg   Post-operative Diagnosis: 1.PROM 2.HSV Outbreak 3.Term Preg  Procedure: CESAREAN SECTION  Surgeon: Delice Lesch, MD    Assistants: Surgical Tech Larene Beach)  Anesthesia: Spinal  Procedure Details  The patient was taken to the operating room secondary to SROM/PROM and recent HSV 2 outbreak after the risks, benefits, complications, treatment options, and expected outcomes were discussed with the patient.  The patient concurred with the proposed plan, giving informed consent which was signed and witnessed. The patient was taken to Operating Room 2, identified as Jane Rivers and the procedure verified as C-Section Delivery. A Time Out was held and the above information confirmed.  After induction of anesthesia by obtaining a spinal, the patient was prepped and draped in the usual sterile manner. A Pfannenstiel skin incision was made and carried down through the subcutaneous tissue to the underlying layer of fascia.  The fascia was incised bilaterally and extended transversely bilaterally with the Mayo scissors. Kocher clamps were placed on the inferior aspect of the fascial incision and the underlying rectus muscle was separated from the fascia. The same was done on the superior aspect of the fascial incision.  The peritoneum was identified, entered bluntly and extended manually.  An [place] self-retaining retractor was placed.  The utero-vesical peritoneal reflection was incised transversely and the bladder flap was bluntly freed from the lower uterine segment. A low transverse uterine incision was made with the scalpel and extended bilaterally with the bandage scissors.  The infant was delivered in vertex position without difficulty.  After the umbilical cord was clamped and cut, the infant was handed to the awaiting pediatricians.   Cord blood was obtained for evaluation.  The placenta was removed intact and appeared to be within normal limits. The uterus was cleared of all clots and debris. The uterine incision was closed with running interlocking sutures of 0 Vicryl and a second imbricating layer was performed as well.   Bilateral tubes and ovaries appeared to be within normal limits.  Good hemostasis was noted.  Copious irrigation was performed until clear.  The peritoneum was repaired with 2-0 chromic via a running suture.  The fascia was reapproximated with a running suture of 0 Vicryl. The subcutaneous tissue was reapproximated with 3 interrupted sutures of 2-0 plain.  The skin was reapproximated with a subcuticular suture of 3-0 monocryl.  Steristrips were applied.  Instrument, sponge, and needle counts were correct prior to abdominal closure and at the conclusion of the case.  The patient was awaiting transfer to the recovery room in good condition.  Findings: Live female infant with Apgars 9 at one minute and 9 at five minutes.  Normal appearing bilateral ovaries and fallopian tubes were noted.  Estimated Blood Loss:  800 ml         Drains: foley to gravity 200 ml         Total IV Fluids: 1200 ml         Specimens to Pathology: Placenta         Complications:  None; patient tolerated the procedure well.         Disposition: PACU - hemodynamically stable.         Condition: stable  Attending Attestation: I performed the procedure.

## 2013-07-24 ENCOUNTER — Encounter (HOSPITAL_COMMUNITY): Payer: Self-pay | Admitting: Obstetrics and Gynecology

## 2013-07-24 LAB — CBC
HEMATOCRIT: 31.7 % — AB (ref 36.0–46.0)
Hemoglobin: 10.7 g/dL — ABNORMAL LOW (ref 12.0–15.0)
MCH: 27.4 pg (ref 26.0–34.0)
MCHC: 33.8 g/dL (ref 30.0–36.0)
MCV: 81.1 fL (ref 78.0–100.0)
PLATELETS: 119 10*3/uL — AB (ref 150–400)
RBC: 3.91 MIL/uL (ref 3.87–5.11)
RDW: 14.2 % (ref 11.5–15.5)
WBC: 8.5 10*3/uL (ref 4.0–10.5)

## 2013-07-24 LAB — RPR: RPR Ser Ql: NONREACTIVE

## 2013-07-24 NOTE — Progress Notes (Signed)
UR chart review completed.  

## 2013-07-24 NOTE — Progress Notes (Signed)
Subjective: Postpartum Day 1: Cesarean Delivery Patient reports tolerating PO.    Objective: Vital signs in last 24 hours: Temp:  [97 F (36.1 C)-99.3 F (37.4 C)] 98.4 F (36.9 C) (01/29 0900) Pulse Rate:  [72-93] 77 (01/29 0900) Resp:  [16-22] 18 (01/29 0900) BP: (90-112)/(55-74) 102/66 mmHg (01/29 0900) SpO2:  [97 %-100 %] 98 % (01/29 0900)  Physical Exam:  General: alert and cooperative Lochia: appropriate Uterine Fundus: firm Incision: healing well, no significant drainage DVT Evaluation: No evidence of DVT seen on physical exam.   Recent Labs  07/23/13 1122 07/24/13 0610  HGB 13.1 10.7*  HCT 38.5 31.7*    Assessment/Plan: Status post Cesarean section. Doing well postoperatively.  Continue current care.  Sanford A 07/24/2013, 11:37 AM

## 2013-07-24 NOTE — Lactation Note (Signed)
This note was copied from the chart of Jane Seirra Rigsby. Lactation Consultation Note  Patient Name: Jane Rivers [id number] Date: 07/24/2013 Reason for consult: Initial assessment;Other (Comment) (charting for exclusion)   Maternal Data Formula Feeding for Exclusion: Yes Reason for exclusion: Mother's choice to formula feed on admision  Feeding    LATCH Score/Interventions                      Lactation Tools Discussed/Used     Consult Status Consult Status: Complete    Bernita Buffy 07/24/2013, 3:39 PM

## 2013-07-25 NOTE — Progress Notes (Signed)
Subjective:  Postpartum Day 2: Cesarean Delivery  Patient reports tolerating PO and + flatus.    Objective: Vital signs in last 24 hours: Temp:  [98.2 F (36.8 C)-98.4 F (36.9 C)] 98.4 F (36.9 C) (01/30 0554) Pulse Rate:  [76-97] 89 (01/30 0554) Resp:  [18-20] 18 (01/30 0554) BP: (97-115)/(60-69) 115/69 mmHg (01/30 0554) SpO2:  [98 %-100 %] 100 % (01/30 0554)  Physical Exam:  General: no distress Lochia: appropriate Uterine Fundus: firm Incision: healing well DVT Evaluation: No evidence of DVT seen on physical exam.   Recent Labs  07/23/13 1122 07/24/13 0610  HGB 13.1 10.7*  HCT 38.5 31.7*    Assessment/Plan: Status post Cesarean section. Doing well postoperatively.  Continue current care. Bottle feeding Plan discharge tomorrow The patient says that she will not be set reactive. I encouraged condoms if needed.  Erastus Bartolomei V 07/25/2013, 8:32 AM

## 2013-07-26 MED ORDER — IBUPROFEN 600 MG PO TABS: 600.0000 mg | ORAL_TABLET | Freq: Four times a day (QID) | ORAL | Status: DC

## 2013-07-26 MED ORDER — OXYCODONE-ACETAMINOPHEN 5-325 MG PO TABS: 1.0000 | ORAL_TABLET | Freq: Four times a day (QID) | ORAL | Status: DC | PRN

## 2013-07-26 NOTE — Progress Notes (Signed)
Subjective: Postpartum Day 3: Cesarean Delivery Patient reports feeling well.  States she has been ambulating, voiding and tol po liquids and solids without difficulty.  Denies weakness or dizziness. Pos flatus and pos BM.  States pain well controlled with meds.  Formula feeding.    Objective: Vital signs in last 24 hours: Temp:  [97.5 F (36.4 C)-98.6 F (37 C)] 97.5 F (36.4 C) (01/31 0625) Pulse Rate:  [84-85] 84 (01/31 0625) Resp:  [18] 18 (01/31 0625) BP: (101-111)/(65-71) 111/71 mmHg (01/31 9937)  Physical Exam:  General: alert, cooperative and no distress Heart:  RRR Lungs:  CTA bilat Breasts:  Sl engorged and firm Abd:  Pos BS x 4 quads, soft, NT Lochia: appropriate, sm rubra Uterine Fundus: firm, NT 2 below umb Incision: healing well with old drainage on honeycomb dsg. Steristrips intact.  No redness or drainage noted DVT Evaluation: No evidence of DVT seen on physical exam. Negative Homan's sign bilat No significant calf/ankle edema.   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  Discharge home with standard precautions and return to clinic in 4-6 weeks. Contraception: Plans abstinence Rx:  Percocet, Motrin, continue PNV as prior to admission  [place] O. 07/26/2013, 9:11 AM

## 2013-07-26 NOTE — Discharge Instructions (Signed)
See practice brochure

## 2013-07-26 NOTE — Discharge Summary (Signed)
Obstetric Discharge Summary Reason for Admission: rupture of membranes Prenatal Procedures: ultrasound Intrapartum Procedures: cesarean: low cervical, transverse Postpartum Procedures: none Complications-Operative and Postpartum: none  Pt admitted with SROM/PROM.  Pt with hx of recent HSV outbreak being txed with Valtrex at the time of admission.  RBA of LTCS due to recent HSV was discussed with the pt by Dr. Mancel Bale and she elected to proceed with LTCS.  Pt underwent LTCS without complications and EBL of 844ml.  Pt's postoperative course uneventful.  HGB  Date Value Range Status  03/30/2010 10.7* 11.6 - 15.9 g/dL Final     Hemoglobin  Date Value Range Status  07/24/2013 10.7* 12.0 - 15.0 g/dL Final     DELTA CHECK NOTED     REPEATED TO VERIFY     HCT  Date Value Range Status  07/24/2013 31.7* 36.0 - 46.0 % Final  03/30/2010 34.5* 34.8 - 46.6 % Final    Physical Exam:  General: alert, cooperative and no distress  Heart: RRR  Lungs: CTA bilat  Breasts: Sl engorged and firm  Abd: Pos BS x 4 quads, soft, NT  Lochia: appropriate, sm rubra  Uterine Fundus: firm, NT 2 below umb  Incision: healing well with old drainage on honeycomb dsg. Steristrips intact. No redness or drainage noted  DVT Evaluation: No evidence of DVT seen on physical exam.  Negative Homan's sign bilat  No significant calf/ankle edema.  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 Schedule an appointment as soon as possible for a visit in 6 weeks.   Contact information:   [address], [address] [place] [address]        Newborn Data: Live born female  Birth Weight: 8 lb 1.8 oz (3680 g) APGAR: 9, 9  Home with mother.  [place] O. 07/26/2013, 9:18 AM

## 2014-04-27 ENCOUNTER — Encounter (HOSPITAL_COMMUNITY): Payer: Self-pay | Admitting: Obstetrics and Gynecology

## 2015-09-27 ENCOUNTER — Emergency Department (INDEPENDENT_AMBULATORY_CARE_PROVIDER_SITE_OTHER)
Admission: EM | Admit: 2015-09-27 | Discharge: 2015-09-27 | Disposition: A | Discharge status: Home / Self Care | Payer: Self-pay | Source: Home / Self Care

## 2015-09-27 ENCOUNTER — Encounter (HOSPITAL_COMMUNITY): Payer: Self-pay | Admitting: Emergency Medicine

## 2015-09-27 DIAGNOSIS — N390 Urinary tract infection, site not specified: Secondary | ICD-10-CM

## 2015-09-27 LAB — POCT URINALYSIS DIP (DEVICE)
Bilirubin Urine: NEGATIVE
Glucose, UA: NEGATIVE mg/dL
Ketones, ur: NEGATIVE mg/dL
NITRITE: NEGATIVE
Protein, ur: 30 mg/dL — AB
Specific Gravity, Urine: 1.02 (ref 1.005–1.030)
UROBILINOGEN UA: 0.2 mg/dL (ref 0.0–1.0)
pH: 7 (ref 5.0–8.0)

## 2015-09-27 MED ORDER — CEPHALEXIN 500 MG PO CAPS: 500.0000 mg | ORAL_CAPSULE | Freq: Four times a day (QID) | ORAL | Status: DC

## 2015-09-27 NOTE — ED Notes (Signed)
Back pain, odor to urine, no burning with urination.  Onset 2 days ago

## 2015-09-27 NOTE — ED Provider Notes (Signed)
CSN: [id number]     Arrival date & time 09/27/15  1259 History   None    Chief Complaint  Patient presents with  . Urinary Tract Infection   (Consider location/radiation/quality/duration/timing/severity/associated sxs/prior Treatment) Patient is a 25 y.o. female presenting with urinary tract infection. The history is provided by the patient.  Urinary Tract Infection Pain quality:  Aching Pain severity:  Mild Onset quality:  Gradual Duration:  3 days Progression:  Unchanged Chronicity:  New Recent urinary tract infections: no   Relieved by:  None tried Worsened by:  Nothing tried Ineffective treatments:  None tried Urinary symptoms: foul-smelling urine and frequent urination   Urinary symptoms: no hematuria and no hesitancy   Associated symptoms: no abdominal pain, no fever, no flank pain and no vaginal discharge     Past Medical History  Diagnosis Date  . Asthma   . Abnormal Pap smear     never had  . Infection     chlamydia   Past Surgical History  Procedure Laterality Date  . No past surgeries    . Cesarean section N/A 07/23/2013    Procedure: CESAREAN SECTION;  Surgeon: Delice Lesch, MD;  Location: [facility];  Service: Obstetrics;  Laterality: N/A;   Family History  Problem Relation Age of Onset  . Hypertension Maternal Aunt   . Hypertension Maternal Grandmother   . Hypertension Maternal Grandfather    Social History  Substance Use Topics  . Smoking status: Never Smoker   . Smokeless tobacco: Never Used  . Alcohol Use: No   OB History    Gravida Para Term Preterm AB TAB SAB Ectopic Multiple Living   1 1 1       1      Review of Systems  Constitutional: Negative.  Negative for fever.  Gastrointestinal: Negative.  Negative for abdominal pain.  Genitourinary: Positive for urgency and frequency. Negative for dysuria, flank pain and vaginal discharge.  All other systems reviewed and are negative.   Allergies  Shellfish allergy  Home Medications   Prior  to Admission medications   Medication Sig Start Date End Date Taking? Authorizing Provider  cephALEXin (KEFLEX) 500 MG capsule Take 1 capsule (500 mg total) by mouth 4 (four) times daily. Take all of medicine and drink lots of fluids 09/27/15   Billy Fischer, MD  ibuprofen (ADVIL,MOTRIN) 600 MG tablet Take 1 tablet (600 mg total) by mouth every 6 (six) hours. 07/26/13   Dione Booze, CNM  oxyCODONE-acetaminophen (PERCOCET/ROXICET) 5-325 MG per tablet Take 1-2 tablets by mouth every 6 (six) hours as needed for severe pain (moderate - severe pain). 07/26/13   Dione Booze, CNM  Prenatal Vit-Fe Fumarate-FA (PRENATAL MULTIVITAMIN) TABS Take 1 tablet by mouth daily at 12 noon.    Historical Provider, MD   Meds Ordered and Administered this Visit  Medications - No data to display  BP 106/70 mmHg  Pulse 84  Temp(Src) 99.2 F (37.3 C) (Oral)  Resp 16  SpO2 98%  LMP 09/25/2015  Breastfeeding? No No data found.   Physical Exam  Constitutional: She is oriented to person, place, and time. She appears well-developed and well-nourished. No distress.  Abdominal: Soft. Bowel sounds are normal. She exhibits no distension. There is no tenderness. There is no rebound and no guarding.  Neurological: She is alert and oriented to person, place, and time.  Skin: Skin is warm and dry.  Nursing note and vitals reviewed.   ED Course  Procedures (including critical care time)  Labs Review Labs Reviewed  POCT URINALYSIS DIP (DEVICE) - Abnormal; Notable for the following:    Hgb urine dipstick LARGE (*)    Protein, ur 30 (*)    Leukocytes, UA TRACE (*)    All other components within normal limits   U/a abnl.  Imaging Review No results found.   Visual Acuity Review  Right Eye Distance:   Left Eye Distance:   Bilateral Distance:    Right Eye Near:   Left Eye Near:    Bilateral Near:         MDM   1. UTI (lower urinary tract infection)     Meds ordered this encounter  Medications  .  cephALEXin (KEFLEX) 500 MG capsule    Sig: Take 1 capsule (500 mg total) by mouth 4 (four) times daily. Take all of medicine and drink lots of fluids    Dispense:  20 capsule    Refill:  0      Billy Fischer, MD 09/27/15 [phone]

## 2015-09-27 NOTE — Discharge Instructions (Signed)
Take all of medicine as directed, drink lots of fluids, see your doctor if further problems. °

## 2016-02-10 ENCOUNTER — Encounter (HOSPITAL_COMMUNITY): Payer: Self-pay | Admitting: *Deleted

## 2016-02-10 ENCOUNTER — Inpatient Hospital Stay (HOSPITAL_COMMUNITY)
Admission: AD | Admit: 2016-02-10 | Discharge: 2016-02-11 | Disposition: A | Discharge status: Home / Self Care | Payer: Medicaid Other | Source: Ambulatory Visit | Attending: Obstetrics & Gynecology | Admitting: Obstetrics & Gynecology

## 2016-02-10 ENCOUNTER — Emergency Department (HOSPITAL_COMMUNITY)
Admission: EM | Admit: 2016-02-10 | Discharge: 2016-02-11 | Disposition: A | Discharge status: Home / Self Care | Payer: Medicaid Other | Attending: Dermatology | Admitting: Dermatology

## 2016-02-10 ENCOUNTER — Encounter (HOSPITAL_COMMUNITY): Payer: Self-pay

## 2016-02-10 DIAGNOSIS — J45909 Unspecified asthma, uncomplicated: Secondary | ICD-10-CM | POA: Insufficient documentation

## 2016-02-10 DIAGNOSIS — N939 Abnormal uterine and vaginal bleeding, unspecified: Secondary | ICD-10-CM | POA: Insufficient documentation

## 2016-02-10 DIAGNOSIS — Z888 Allergy status to other drugs, medicaments and biological substances status: Secondary | ICD-10-CM | POA: Insufficient documentation

## 2016-02-10 DIAGNOSIS — Z79899 Other long term (current) drug therapy: Secondary | ICD-10-CM | POA: Insufficient documentation

## 2016-02-10 DIAGNOSIS — Z9889 Other specified postprocedural states: Secondary | ICD-10-CM | POA: Insufficient documentation

## 2016-02-10 DIAGNOSIS — K0889 Other specified disorders of teeth and supporting structures: Secondary | ICD-10-CM | POA: Insufficient documentation

## 2016-02-10 DIAGNOSIS — Z5321 Procedure and treatment not carried out due to patient leaving prior to being seen by health care provider: Secondary | ICD-10-CM | POA: Insufficient documentation

## 2016-02-10 LAB — URINE MICROSCOPIC-ADD ON

## 2016-02-10 LAB — CBC
HCT: 31.8 % — ABNORMAL LOW (ref 36.0–46.0)
Hemoglobin: 10 g/dL — ABNORMAL LOW (ref 12.0–15.0)
MCH: 23.3 pg — AB (ref 26.0–34.0)
MCHC: 31.4 g/dL (ref 30.0–36.0)
MCV: 74.1 fL — AB (ref 78.0–100.0)
PLATELETS: 227 10*3/uL (ref 150–400)
RBC: 4.29 MIL/uL (ref 3.87–5.11)
RDW: 14.4 % (ref 11.5–15.5)
WBC: 5.5 10*3/uL (ref 4.0–10.5)

## 2016-02-10 LAB — URINALYSIS, ROUTINE W REFLEX MICROSCOPIC
Bilirubin Urine: NEGATIVE
Glucose, UA: NEGATIVE mg/dL
Ketones, ur: NEGATIVE mg/dL
LEUKOCYTES UA: NEGATIVE
Nitrite: NEGATIVE
PROTEIN: NEGATIVE mg/dL
SPECIFIC GRAVITY, URINE: 1.02 (ref 1.005–1.030)
pH: 7 (ref 5.0–8.0)

## 2016-02-10 LAB — POCT PREGNANCY, URINE: PREG TEST UR: NEGATIVE

## 2016-02-10 NOTE — MAU Note (Signed)
Pt reports she started her period on 8/15 and has been passing really big clots. Having some cramping off/on , none now.

## 2016-02-10 NOTE — ED Triage Notes (Signed)
Pt states that she is having tooth pain because her wisdom tooth is coming in, pain has been going on for about one year. Pt also c/o of vaginal bleeding, on third day of period and passed two very large clots yesterday, changing pads every hour.

## 2016-02-11 DIAGNOSIS — N939 Abnormal uterine and vaginal bleeding, unspecified: Secondary | ICD-10-CM

## 2016-02-11 LAB — WET PREP, GENITAL
CLUE CELLS WET PREP: NONE SEEN
Sperm: NONE SEEN
TRICH WET PREP: NONE SEEN
YEAST WET PREP: NONE SEEN

## 2016-02-11 LAB — HIV ANTIBODY (ROUTINE TESTING W REFLEX): HIV Screen 4th Generation wRfx: NONREACTIVE

## 2016-02-11 LAB — GC/CHLAMYDIA PROBE AMP (~~LOC~~) NOT AT ARMC
Chlamydia: POSITIVE — AB
Neisseria Gonorrhea: NEGATIVE

## 2016-02-11 LAB — RPR: RPR Ser Ql: NONREACTIVE

## 2016-02-11 NOTE — Discharge Instructions (Signed)
Abnormal Uterine Bleeding Abnormal uterine bleeding can affect women at various stages in life, including teenagers, women in their reproductive years, pregnant women, and women who have reached menopause. Several kinds of uterine bleeding are considered abnormal, including:  Bleeding or spotting between periods.   Bleeding after sexual intercourse.   Bleeding that is heavier or more than normal.   Periods that last longer than usual.  Bleeding after menopause.  Many cases of abnormal uterine bleeding are minor and simple to treat, while others are more serious. Any type of abnormal bleeding should be evaluated by your health care provider. Treatment will depend on the cause of the bleeding. HOME CARE INSTRUCTIONS Monitor your condition for any changes. The following actions may help to alleviate any discomfort you are experiencing:  Avoid the use of tampons and douches as directed by your health care provider.  Change your pads frequently. You should get regular pelvic exams and Pap tests. Keep all follow-up appointments for diagnostic tests as directed by your health care provider.  SEEK MEDICAL CARE IF:   Your bleeding lasts more than 1 week.   You feel dizzy at times.  SEEK IMMEDIATE MEDICAL CARE IF:   You pass out.   You are changing pads every 15 to 30 minutes.   You have abdominal pain.  You have a fever.   You become sweaty or weak.   You are passing large blood clots from the vagina.   You start to feel nauseous and vomit. MAKE SURE YOU:   Understand these instructions.  Will watch your condition.  Will get help right away if you are not doing well or get worse.   This information is not intended to replace advice given to you by your health care provider. Make sure you discuss any questions you have with your health care provider.

## 2016-02-11 NOTE — MAU Provider Note (Signed)
History     CSN: [id number]  Arrival date and time: 02/10/16 2155   First Provider Initiated Contact with Patient 02/11/16 0025      Chief Complaint  Patient presents with  . Vaginal Bleeding   Vaginal Bleeding  The patient's primary symptoms include vaginal discharge. This is a new problem. Episode onset: 02/08/16. The problem occurs constantly. The problem has been unchanged. The patient is experiencing no pain. She is not pregnant. Pertinent negatives include no abdominal pain, chills, constipation, diarrhea, dysuria, fever, frequency, nausea, urgency or vomiting. The vaginal bleeding is heavier than menses. She has been passing clots. She has not been passing tissue. Nothing aggravates the symptoms. She has tried nothing for the symptoms. She uses nothing for contraception. Her menstrual history has been regular.    Past Medical History:  Diagnosis Date  . Abnormal Pap smear    never had  . Asthma   . Infection    chlamydia    Past Surgical History:  Procedure Laterality Date  . CESAREAN SECTION N/A 07/23/2013   Procedure: CESAREAN SECTION;  Surgeon: Delice Lesch, MD;  Location: [facility];  Service: Obstetrics;  Laterality: N/A;  . NO PAST SURGERIES      Family History  Problem Relation Age of Onset  . Hypertension Maternal Aunt   . Hypertension Maternal Grandmother   . Hypertension Maternal Grandfather     Social History  Substance Use Topics  . Smoking status: Never Smoker  . Smokeless tobacco: Never Used  . Alcohol use No    Allergies:  Allergies  Allergen Reactions  . Shellfish Allergy Nausea And Vomiting    Prescriptions Prior to Admission  Medication Sig Dispense Refill Last Dose  . cephALEXin (KEFLEX) 500 MG capsule Take 1 capsule (500 mg total) by mouth 4 (four) times daily. Take all of medicine and drink lots of fluids 20 capsule 0   . ibuprofen (ADVIL,MOTRIN) 600 MG tablet Take 1 tablet (600 mg total) by mouth every 6 (six) hours. 30 tablet 1  Unknown at Unknown time  . oxyCODONE-acetaminophen (PERCOCET/ROXICET) 5-325 MG per tablet Take 1-2 tablets by mouth every 6 (six) hours as needed for severe pain (moderate - severe pain). 30 tablet 0 Unknown at Unknown time  . Prenatal Vit-Fe Fumarate-FA (PRENATAL MULTIVITAMIN) TABS Take 1 tablet by mouth daily at 12 noon.   Unknown at Unknown time    Review of Systems  Constitutional: Negative for chills and fever.  Gastrointestinal: Negative for abdominal pain, constipation, diarrhea, nausea and vomiting.  Genitourinary: Positive for vaginal bleeding and vaginal discharge. Negative for dysuria, frequency and urgency.  Neurological: Positive for dizziness (better now only this morning. ).   Physical Exam   Blood pressure 121/69, pulse 86, temperature 98.5 F (36.9 C), temperature source Oral, resp. rate 17, height 5\' 2"  (1.575 m), weight 201 lb (91.2 kg), last menstrual period 02/08/2016, SpO2 100 %.  Physical Exam  Nursing note and vitals reviewed. Constitutional: She is oriented to person, place, and time. She appears well-developed and well-nourished. No distress.  HENT:  Head: Normocephalic.  Cardiovascular: Normal rate.   Respiratory: Effort normal.  GI: Soft. There is no tenderness. There is no rebound.  Genitourinary:  Genitourinary Comments:  External: no lesion Vagina: small amount of blood seen  Cervix: pink, smooth, no CMT Uterus: NSSC Adnexa: NT   Neurological: She is alert and oriented to person, place, and time.  Skin: Skin is warm and dry.  Psychiatric: She has a normal mood and  affect.     Results for orders placed or performed during the hospital encounter of 02/10/16 (from the past 24 hour(s))  Urinalysis, Routine w reflex microscopic (not at [facility])     Status: Abnormal   Collection Time: 02/10/16 10:25 PM  Result Value Ref Range   Color, Urine YELLOW YELLOW   APPearance CLEAR CLEAR   Specific Gravity, Urine 1.020 1.005 - 1.030   pH 7.0 5.0 - 8.0    Glucose, UA NEGATIVE NEGATIVE mg/dL   Hgb urine dipstick LARGE (A) NEGATIVE   Bilirubin Urine NEGATIVE NEGATIVE   Ketones, ur NEGATIVE NEGATIVE mg/dL   Protein, ur NEGATIVE NEGATIVE mg/dL   Nitrite NEGATIVE NEGATIVE   Leukocytes, UA NEGATIVE NEGATIVE  Urine microscopic-add on     Status: Abnormal   Collection Time: 02/10/16 10:25 PM  Result Value Ref Range   Squamous Epithelial / LPF 0-5 (A) NONE SEEN   WBC, UA 0-5 0 - 5 WBC/hpf   RBC / HPF TOO NUMEROUS TO COUNT 0 - 5 RBC/hpf   Bacteria, UA RARE (A) NONE SEEN   Urine-Other URINALYSIS PERFORMED ON SUPERNATANT   Pregnancy, urine POC     Status: None   Collection Time: 02/10/16 10:36 PM  Result Value Ref Range   Preg Test, Ur NEGATIVE NEGATIVE  CBC     Status: Abnormal   Collection Time: 02/10/16 11:25 PM  Result Value Ref Range   WBC 5.5 4.0 - 10.5 K/uL   RBC 4.29 3.87 - 5.11 MIL/uL   Hemoglobin 10.0 (L) 12.0 - 15.0 g/dL   HCT 31.8 (L) 36.0 - 46.0 %   MCV 74.1 (L) 78.0 - 100.0 fL   MCH 23.3 (L) 26.0 - 34.0 pg   MCHC 31.4 30.0 - 36.0 g/dL   RDW 14.4 11.5 - 15.5 %   Platelets 227 150 - 400 K/uL  Wet prep, genital     Status: Abnormal   Collection Time: 02/11/16 12:35 AM  Result Value Ref Range   Yeast Wet Prep HPF POC NONE SEEN NONE SEEN   Trich, Wet Prep NONE SEEN NONE SEEN   Clue Cells Wet Prep HPF POC NONE SEEN NONE SEEN   WBC, Wet Prep HPF POC MODERATE (A) NONE SEEN   Sperm NONE SEEN     MAU Course  Procedures  MDM   Assessment and Plan   1. Abnormal uterine bleeding    DC home Comfort measures reviewed  Bleeding precautions RX: none  Return to MAU as needed   [place] [facility]. Call today.   Specialty:  Obstetrics and Gynecology Why:  if no improvement  Contact information: [place] [phone]           Mathis Bud 02/11/2016, 1:01 AM

## 2016-02-11 NOTE — ED Notes (Signed)
Pt called for vitals recheck x3. No answer.  

## 2016-02-13 ENCOUNTER — Inpatient Hospital Stay (HOSPITAL_COMMUNITY)
Admission: AD | Admit: 2016-02-13 | Discharge: 2016-02-13 | Disposition: A | Discharge status: Home / Self Care | Payer: Medicaid Other | Source: Ambulatory Visit | Attending: Obstetrics and Gynecology | Admitting: Obstetrics and Gynecology

## 2016-02-13 DIAGNOSIS — Z0189 Encounter for other specified special examinations: Secondary | ICD-10-CM | POA: Insufficient documentation

## 2016-02-13 DIAGNOSIS — A749 Chlamydial infection, unspecified: Secondary | ICD-10-CM

## 2016-02-13 MED ORDER — AZITHROMYCIN 250 MG PO TABS
1000.0000 mg | ORAL_TABLET | Freq: Once | ORAL | Status: AC
  Administered 2016-02-13: 1000 mg via ORAL
  Filled 2016-02-13: qty 4

## 2016-02-13 NOTE — Discharge Instructions (Signed)
Chlamydia, Female Chlamydia is an infection. It is spread from one person to another person during sexual contact. This infection can be in the cervix, urine tube (urethra), throat, or bottom (rectum). This infection needs treatment. HOME CARE   Take your medicines (antibiotics) as told. Finish them even if you start to feel better.  Only take medicine as told by your doctor.  Tell your sex partner(s) that you have chlamydia. They must also be treated.  Do not have sex until your doctor says it is okay.  Rest.  Eat healthy. Drink enough fluids to keep your pee (urine) clear or pale yellow.  Keep all doctor visits as told. GET HELP IF:  You have pain when you pee.  You have belly pain.  You have vaginal discharge.  You have pain during sex.  You have bleeding between periods and after sex.  You have a fever. GET HELP RIGHT AWAY IF:   You feel sick to your stomach (nauseous) or you throw up (vomit).  You sweat much more than normal (diaphoresis).  You have trouble swallowing.   This information is not intended to replace advice given to you by your health care provider. Make sure you discuss any questions you have with your health care provider.   Document Released: 03/21/2008 Document Revised: 03/03/2015 Document Reviewed: 02/17/2013 Elsevier Interactive Patient Education [employer].

## 2016-02-13 NOTE — MAU Provider Note (Signed)
Pt is alert, WDWN pleasant 25 y.o.in NAD not pregnant [id number] Who was seen on 02/11/2016.  Pt had STD testing at that time. RN note: 3:33 PM      [] Hide copied text [] Hover for attribution information Patient wants testing for STDs explained was tested 2 days ago, bleeding stopped then started back.     Results reviewed from visit 02/11/2016 with positive Chlamdyia IMp-Chlamydia Disp- treated with Zithromax 1 gm PO while in MAU Partner and other partners to be treated- recommended [facility] Pt to go to [facility] for f/u- discussed effective contraception and pap smear needed [place] P[place], NP

## 2016-02-13 NOTE — MAU Note (Signed)
Patient wants testing for STDs explained was tested 2 days ago, bleeding stopped then started back.

## 2016-02-28 ENCOUNTER — Encounter (HOSPITAL_COMMUNITY): Payer: Self-pay | Admitting: Vascular Surgery

## 2016-02-28 ENCOUNTER — Emergency Department (HOSPITAL_COMMUNITY)
Admission: EM | Admit: 2016-02-28 | Discharge: 2016-02-28 | Disposition: A | Discharge status: Home / Self Care | Payer: Medicaid Other | Attending: Emergency Medicine | Admitting: Emergency Medicine

## 2016-02-28 DIAGNOSIS — K0520 Aggressive periodontitis, unspecified: Secondary | ICD-10-CM

## 2016-02-28 DIAGNOSIS — Z79899 Other long term (current) drug therapy: Secondary | ICD-10-CM | POA: Insufficient documentation

## 2016-02-28 DIAGNOSIS — K047 Periapical abscess without sinus: Secondary | ICD-10-CM

## 2016-02-28 DIAGNOSIS — J45909 Unspecified asthma, uncomplicated: Secondary | ICD-10-CM | POA: Insufficient documentation

## 2016-02-28 DIAGNOSIS — M2762 Post-osseointegration biological failure of dental implant: Secondary | ICD-10-CM | POA: Insufficient documentation

## 2016-02-28 MED ORDER — AMOXICILLIN 500 MG PO CAPS
500.0000 mg | ORAL_CAPSULE | Freq: Once | ORAL | Status: AC
  Administered 2016-02-28: 500 mg via ORAL
  Filled 2016-02-28: qty 1

## 2016-02-28 MED ORDER — HYDROCODONE-ACETAMINOPHEN 5-325 MG PO TABS
1.0000 | ORAL_TABLET | Freq: Once | ORAL | Status: AC
  Administered 2016-02-28: 1 via ORAL
  Filled 2016-02-28: qty 1

## 2016-02-28 MED ORDER — AMOXICILLIN 500 MG PO CAPS: 500.0000 mg | ORAL_CAPSULE | Freq: Three times a day (TID) | ORAL | 0 refills | Status: DC

## 2016-02-28 MED ORDER — NAPROXEN 500 MG PO TABS: 500.0000 mg | ORAL_TABLET | Freq: Two times a day (BID) | ORAL | 0 refills | Status: DC

## 2016-02-28 NOTE — ED Triage Notes (Signed)
Pt reports to the ED for eval of wisdom tooth pain. She reports her wisdom teeth have started cutting through her gums. She does not have any insurance so she has been to the dentist. Denies any N/V.

## 2016-02-28 NOTE — ED Provider Notes (Signed)
[facility] DEPT Provider Note   CSN: [id number] Arrival date & time: 02/28/16  2158   By signing my name below, I, Estanislado Pandy, attest that this documentation has been prepared under the direction and in the presence of [facility]. Janit Bern, NP. Electronically Signed: Estanislado Pandy, Scribe. 02/28/2016. 10:44 PM.   History   Chief Complaint Chief Complaint  Patient presents with  . Dental Pain    The history is provided by the patient. No language interpreter was used.  Dental Pain   This is a chronic problem. Episode onset: 1 year. The problem occurs constantly. The problem has not changed since onset.  HPI Comments:  Jane Rivers is a 25 y.o. female who presents to the Emergency Department complaining of chronic, constant wisdom tooth pain x 1 year. Pt reports that wisdom teeth are beginning to cut through her gums. Pt complains of associated ear pain. Pt denies nausea, vomiting.   Past Medical History:  Diagnosis Date  . Abnormal Pap smear    never had  . Asthma   . Infection    chlamydia    Patient Active Problem List   Diagnosis Date Noted  . S/P primary low transverse C-section 07/23/2013  . HEADACHE 09/14/2009  . PLANTAR FASCIITIS 08/27/2009  . CONTACT DERMATITIS 06/16/2009  . LEUKOPENIA, CHRONIC 03/15/2009  . BACK PAIN, LUMBAR 02/18/2009  . IRON DEFICIENCY 11/20/2007  . PICA 11/20/2007  . ALLERGIC RHINITIS 10/11/2007  . ASTHMA 10/11/2007  . SEBORRHEIC DERMATITIS 10/11/2007  . UNSPECIFIED ALOPECIA 10/11/2007    Past Surgical History:  Procedure Laterality Date  . CESAREAN SECTION N/A 07/23/2013   Procedure: CESAREAN SECTION;  Surgeon: Delice Lesch, MD;  Location: [facility];  Service: Obstetrics;  Laterality: N/A;  . NO PAST SURGERIES      OB History    Gravida Para Term Preterm AB Living   1 1 1     1    SAB TAB Ectopic Multiple Live Births           1       Home Medications    Prior to Admission medications   Medication Sig Start Date End  Date Taking? Authorizing Provider  amoxicillin (AMOXIL) 500 MG capsule Take 1 capsule (500 mg total) by mouth 3 (three) times daily. 02/28/16   Namiyah Grantham Bunnie Pion, NP  ibuprofen (ADVIL,MOTRIN) 600 MG tablet Take 1 tablet (600 mg total) by mouth every 6 (six) hours. 07/26/13   Dione Booze, CNM  naproxen (NAPROSYN) 500 MG tablet Take 1 tablet (500 mg total) by mouth 2 (two) times daily. 02/28/16   Foreman, NP  Prenatal Vit-Fe Fumarate-FA (PRENATAL MULTIVITAMIN) TABS Take 1 tablet by mouth daily at 12 noon.    Historical Provider, MD    Family History Family History  Problem Relation Age of Onset  . Hypertension Maternal Aunt   . Hypertension Maternal Grandmother   . Hypertension Maternal Grandfather     Social History Social History  Substance Use Topics  . Smoking status: Never Smoker  . Smokeless tobacco: Never Used  . Alcohol use No     Allergies   Shellfish allergy   Review of Systems Review of Systems  HENT: Positive for dental problem and ear pain.   Gastrointestinal: Negative for nausea and vomiting.  All other systems reviewed and are negative.    Physical Exam Updated Vital Signs BP 122/76 (BP Location: Right Arm)   Pulse 97   Temp 98 F (36.7 C) (Oral)   Resp  16   LMP 02/08/2016   SpO2 98%   Physical Exam  Constitutional: She appears well-developed and well-nourished. No distress.  HENT:  Head: Normocephalic and atraumatic.  Left upper 3rd molar partially erupted. Swelling and tenderness to gum surrounding the tooth.   Eyes: Conjunctivae and EOM are normal.  Neck: Normal range of motion.  Cardiovascular: Normal rate.   Pulmonary/Chest: Effort normal.  Abdominal: She exhibits no distension.  Lymphadenopathy:    She has no cervical adenopathy.  Neurological: She is alert.  Skin: Skin is warm and dry.  Psychiatric: She has a normal mood and affect. Her behavior is normal.  Nursing note and vitals reviewed.    ED Treatments / Results  DIAGNOSTIC  STUDIES:  Oxygen Saturation is 98% on RA, normal by my interpretation.    COORDINATION OF CARE:  10:34 PM Will give antibiotics. Discussed treatment plan with pt at bedside and pt agreed to plan.  Labs (all labs ordered are listed, but only abnormal results are displayed) Labs Reviewed - No data to display  Radiology No results found.  Procedures Procedures (including critical care time)  Medications Ordered in ED Medications  amoxicillin (AMOXIL) capsule 500 mg (500 mg Oral Given 02/28/16 2257)  HYDROcodone-acetaminophen (NORCO/VICODIN) 5-325 MG per tablet 1 tablet (1 tablet Oral Given 02/28/16 2257)     Initial Impression / Assessment and Plan / ED Course  Patient with dentalgia.  No abscess requiring immediate incision and drainage.  Exam not concerning for Ludwig's angina or pharyngeal abscess.  Will treat with antibiotic. Pt instructed to follow-up with dentist.  Discussed return precautions. Pt safe for discharge.  I have reviewed the triage vital signs and the nursing notes.   Clinical Course    Final Clinical Impressions(s) / ED Diagnoses   Final diagnoses:  Acute pericoronitis  Dental infection    New Prescriptions Discharge Medication List as of 02/28/2016 10:47 PM    START taking these medications   Details  amoxicillin (AMOXIL) 500 MG capsule Take 1 capsule (500 mg total) by mouth 3 (three) times daily., Starting Mon 02/28/2016, Print    naproxen (NAPROSYN) 500 MG tablet Take 1 tablet (500 mg total) by mouth 2 (two) times daily., Starting Mon 02/28/2016, Print      I personally performed the services described in this documentation, which was scribed in my presence. The recorded information has been reviewed and is accurate.     [address] [place], [place] 03/01/16 [place], MD 03/01/16 [phone]

## 2016-04-17 ENCOUNTER — Ambulatory Visit: Payer: Self-pay | Admitting: Internal Medicine

## 2016-05-17 ENCOUNTER — Encounter (HOSPITAL_COMMUNITY): Payer: Self-pay | Admitting: Emergency Medicine

## 2016-05-17 ENCOUNTER — Emergency Department (HOSPITAL_COMMUNITY)
Admission: EM | Admit: 2016-05-17 | Discharge: 2016-05-17 | Disposition: A | Discharge status: Home / Self Care | Payer: Medicaid Other | Attending: Emergency Medicine | Admitting: Emergency Medicine

## 2016-05-17 DIAGNOSIS — J45909 Unspecified asthma, uncomplicated: Secondary | ICD-10-CM | POA: Insufficient documentation

## 2016-05-17 DIAGNOSIS — J069 Acute upper respiratory infection, unspecified: Secondary | ICD-10-CM

## 2016-05-17 LAB — RAPID STREP SCREEN (MED CTR MEBANE ONLY): STREPTOCOCCUS, GROUP A SCREEN (DIRECT): NEGATIVE

## 2016-05-17 MED ORDER — FLUTICASONE PROPIONATE 50 MCG/ACT NA SUSP: 2.0000 | Freq: Every day | NASAL | 0 refills | Status: DC

## 2016-05-17 MED ORDER — LIDOCAINE VISCOUS 2 % MT SOLN: 15.0000 mL | OROMUCOSAL | 0 refills | Status: DC | PRN

## 2016-05-17 MED ORDER — ACETAMINOPHEN 325 MG PO TABS
650.0000 mg | ORAL_TABLET | Freq: Once | ORAL | Status: AC
  Administered 2016-05-17: 650 mg via ORAL
  Filled 2016-05-17: qty 2

## 2016-05-17 MED ORDER — LIDOCAINE VISCOUS 2 % MT SOLN
15.0000 mL | Freq: Once | OROMUCOSAL | Status: AC
  Administered 2016-05-17: 15 mL via OROMUCOSAL
  Filled 2016-05-17: qty 15

## 2016-05-17 MED ORDER — PSEUDOEPHEDRINE HCL ER 120 MG PO TB12: 120.0000 mg | ORAL_TABLET | Freq: Two times a day (BID) | ORAL | 0 refills | Status: DC

## 2016-05-17 NOTE — ED Triage Notes (Signed)
Pt complains of sore throat. Pt states the duration has been 3 days. There are white patches on the back of the throat and the throat appears inflamed. Denies fever, no fever today.

## 2016-05-17 NOTE — ED Provider Notes (Signed)
[facility] DEPT Provider Note   CSN: [id number] Arrival date & time: 05/17/16  2102 By signing my name below, I, Jane Rivers, attest that this documentation has been prepared under the direction and in the presence of [facility], [place]. Electronically Signed: Doran Rivers, ED Scribe. 05/18/16. 10:23 PM.  History   Chief Complaint Chief Complaint  Patient presents with  . Sore Throat   The history is provided by the patient. No language interpreter was used.   HPI Comments: Jane Rivers is a 25 y.o. female who presents to the Emergency Department with no pertinent PMHx complaining of ongoing, constant sore throat with painful swallowing for the past 3 days. Pt also reports associated nasal congestion, chills, and ear pain. Pt denies any aggravating or alleviating factors. Pt took Musinex with no relief. Pt denies any fevers, sinus pressure, watery eye discharge, headache, cough, CP, SOB, N/V, myalgias, or any other symptoms at this time. Pt works at [employer] and may have had contact with someone sick.    Past Medical History:  Diagnosis Date  . Abnormal Pap smear    never had  . Asthma   . Infection    chlamydia   Patient Active Problem List   Diagnosis Date Noted  . S/P primary low transverse C-section 07/23/2013  . HEADACHE 09/14/2009  . PLANTAR FASCIITIS 08/27/2009  . CONTACT DERMATITIS 06/16/2009  . LEUKOPENIA, CHRONIC 03/15/2009  . BACK PAIN, LUMBAR 02/18/2009  . IRON DEFICIENCY 11/20/2007  . PICA 11/20/2007  . ALLERGIC RHINITIS 10/11/2007  . ASTHMA 10/11/2007  . SEBORRHEIC DERMATITIS 10/11/2007  . UNSPECIFIED ALOPECIA 10/11/2007   Past Surgical History:  Procedure Laterality Date  . CESAREAN SECTION N/A 07/23/2013   Procedure: CESAREAN SECTION;  Surgeon: Delice Lesch, MD;  Location: [facility];  Service: Obstetrics;  Laterality: N/A;  . NO PAST SURGERIES     OB History    Gravida Para Term Preterm AB Living   1 1 1     1    SAB TAB Ectopic  Multiple Live Births           1     Home Medications    Prior to Admission medications   Medication Sig Start Date End Date Taking? Authorizing Provider  amoxicillin (AMOXIL) 500 MG capsule Take 1 capsule (500 mg total) by mouth 3 (three) times daily. 02/28/16   [name], NP  fluticasone (FLONASE) 50 MCG/ACT nasal spray Place 2 sprays into both nostrils daily. 05/17/16   [name], PA-C  ibuprofen (ADVIL,MOTRIN) 600 MG tablet Take 1 tablet (600 mg total) by mouth every 6 (six) hours. 07/26/13   Dione Booze, CNM  lidocaine (XYLOCAINE) 2 % solution Use as directed 15 mLs in the mouth or throat every 4 (four) hours as needed for mouth pain. 05/17/16   [name], PA-C  naproxen (NAPROSYN) 500 MG tablet Take 1 tablet (500 mg total) by mouth 2 (two) times daily. 02/28/16   [name], NP  Prenatal Vit-Fe Fumarate-FA (PRENATAL MULTIVITAMIN) TABS Take 1 tablet by mouth daily at 12 noon.    Historical Provider, MD  pseudoephedrine (SUDAFED 12 HOUR) 120 MG 12 hr tablet Take 1 tablet (120 mg total) by mouth 2 (two) times daily. 05/17/16   [name], PA-C   Family History Family History  Problem Relation Age of Onset  . Hypertension Maternal Aunt   . Hypertension Maternal Grandmother   . Hypertension Maternal Grandfather    Social History Social History  Substance Use Topics  . Smoking status: Never Smoker  . Smokeless tobacco: Never Used  . Alcohol use No   Allergies   Shellfish allergy  Review of Systems Review of Systems  Constitutional: Positive for chills. Negative for fever.  HENT: Positive for congestion, ear pain and sore throat. Negative for rhinorrhea and sinus pressure.   Eyes: Negative for discharge.  Respiratory: Negative for cough and shortness of breath.   Cardiovascular: Negative for chest pain.  Gastrointestinal: Negative for abdominal pain, nausea and vomiting.  Musculoskeletal: Negative for arthralgias and myalgias.  Neurological:  Negative for headaches.   Physical Exam Updated Vital Signs BP 114/68 (BP Location: Left Arm)   Pulse 92   Temp 98.4 F (36.9 C) (Oral)   Resp 16   Ht 5\' 2"  (1.575 m)   Wt 89.8 kg   LMP 05/01/2016   SpO2 100%   BMI 36.21 kg/m   Physical Exam  Constitutional: She appears well-developed and well-nourished. No distress.  HENT:  Head: Normocephalic and atraumatic.  Right Ear: Tympanic membrane is bulging. Tympanic membrane is not injected and not erythematous.  Left Ear: Tympanic membrane is bulging. Tympanic membrane is not injected and not erythematous. A middle ear effusion is present.  Nose: Mucosal edema present. Right sinus exhibits no maxillary sinus tenderness and no frontal sinus tenderness. Left sinus exhibits no maxillary sinus tenderness and no frontal sinus tenderness.  Mouth/Throat: Uvula is midline and mucous membranes are normal. No trismus in the jaw. Posterior oropharyngeal erythema present. Tonsillar exudate.  Right TM bulging w/o injection or erythema. Left TM effusion without injection or erythema. Left nasal turbinate swelling. No trismus. Uvula midline. Oropharynx erythema.    Eyes: Conjunctivae are normal. No scleral icterus.  Neck: Normal range of motion. No neck rigidity. Normal range of motion present.  No nuchal rigidity.   Cardiovascular: Normal rate.   Pulmonary/Chest: Effort normal and breath sounds normal. No stridor. No respiratory distress. She has no decreased breath sounds. She has no wheezes. She has no rhonchi. She has no rales.  Symmetric chest expansion. Respirations unlabored. No wheezing or rales. No hypoxia.   Lymphadenopathy:    She has no cervical adenopathy.  Neurological: She is alert. She is not disoriented. GCS eye subscore is 4. GCS verbal subscore is 5. GCS motor subscore is 6.  Skin: Skin is warm and dry. She is not diaphoretic.  Psychiatric: She has a normal mood and affect. Her behavior is normal.  Nursing note and vitals  reviewed.   ED Treatments / Results  DIAGNOSTIC STUDIES: Oxygen Saturation is 99% on room air, normal by my interpretation.    COORDINATION OF CARE: 10:23 PM Discussed treatment plan with pt at bedside and pt agreed to plan.  Labs (all labs ordered are listed, but only abnormal results are displayed) Labs Reviewed  RAPID STREP SCREEN (NOT AT [facility])  CULTURE, GROUP A STREP [facility])    EKG  EKG Interpretation None       Radiology No results found.  Procedures Procedures (including critical care time)  Medications Ordered in ED Medications  acetaminophen (TYLENOL) tablet 650 mg (650 mg Oral Given 05/17/16 2251)  lidocaine (XYLOCAINE) 2 % viscous mouth solution 15 mL (15 mLs Mouth/Throat Given 05/17/16 2251)     Initial Impression / Assessment and Plan / ED Course  I have reviewed the triage vital signs and the nursing notes.  Pertinent labs & imaging results that were available during my care of the patient were reviewed by  me and considered in my medical decision making (see chart for details).  Clinical Course    Patient presents to ED with complaint of sore throat, nasal congestion, and ear pressure x 3 days. Patient is afebrile and non-toxic appearing in NAD. VSS. Heart RRR. Lungs CTABL. Posterior oropharynx erythema with tonsillar exudate. No trismus. Uvula midline. No nuchal rigidity. B/l TM bulging without erythema or injection. Left sided nasal mucosa edema. Rapid step negative. Lungs are CTABL, respirations unlabored, no hypoxia - low suspicion for PNA at this time. Suspect URI. Discussed results and plan with pt. Discussed that antibiotics are not indicated for viral infections. Pt will be discharged with symptomatic treatment. Tylenol/motrin for pain relief. Rx viscous lidocaine, sudafed, and flonase. Follow up with PCP if sxs persist. Return precautions given. Verbalizes understanding and is agreeable with plan.        MDM Number of Diagnoses or Management  Options Upper respiratory tract infection, unspecified type:    Final Clinical Impressions(s) / ED Diagnoses   Final diagnoses:  Upper respiratory tract infection, unspecified type   New Prescriptions Discharge Medication List as of 05/17/2016 11:03 PM    START taking these medications   Details  fluticasone (FLONASE) 50 MCG/ACT nasal spray Place 2 sprays into both nostrils daily., Starting Wed 05/17/2016, Print    lidocaine (XYLOCAINE) 2 % solution Use as directed 15 mLs in the mouth or throat every 4 (four) hours as needed for mouth pain., Starting Wed 05/17/2016, Print    pseudoephedrine (SUDAFED 12 HOUR) 120 MG 12 hr tablet Take 1 tablet (120 mg total) by mouth 2 (two) times daily., Starting Wed 05/17/2016, Print        I personally performed the services described in this documentation, which was scribed in my presence. The recorded information has been reviewed and is accurat   [name], PA-C 05/18/16 [place], MD 05/18/16 1001

## 2016-05-17 NOTE — ED Notes (Signed)
See EDP assessment 

## 2016-05-17 NOTE — Discharge Instructions (Signed)
Read the information below.  Your rapid strep was negative. It is being sent for culture. You will be notified if positive and placed on antibiotics.  You can take tylenol/motrin for pain relief. I have prescribed flonase for relief of nasal symptoms. I have prescribed viscous lidocaine for relief of sore throat. I have prescribed sudafed for relief of nasal symptoms.  Drink warm fluids and gargle with warm salt water. Be sure to drink plenty of fluids, get plenty of rest, and wash hands regularly.  Use the prescribed medication as directed.  Please discuss all new medications with your pharmacist.   Be sure to follow up with your primary provider if symptoms persist for more than 10-14 days.  You may return to the Emergency Department at any time for worsening condition or any new symptoms that concern you. Return to ED if you cannot open your mouth, are having trouble swallowing, trouble breathing, neck pain, chest pain, shortness of breath, or any other new/concerning symptoms.

## 2016-05-20 LAB — CULTURE, GROUP A STREP (THRC)

## 2016-05-31 ENCOUNTER — Ambulatory Visit: Payer: Self-pay | Admitting: Internal Medicine

## 2018-02-20 ENCOUNTER — Ambulatory Visit (HOSPITAL_COMMUNITY)
Admission: EM | Admit: 2018-02-20 | Discharge: 2018-02-20 | Disposition: A | Discharge status: Home / Self Care | Payer: Self-pay | Attending: Family Medicine | Admitting: Family Medicine

## 2018-02-20 ENCOUNTER — Encounter (HOSPITAL_COMMUNITY): Payer: Self-pay | Admitting: Emergency Medicine

## 2018-02-20 ENCOUNTER — Other Ambulatory Visit: Payer: Self-pay

## 2018-02-20 DIAGNOSIS — J029 Acute pharyngitis, unspecified: Secondary | ICD-10-CM | POA: Insufficient documentation

## 2018-02-20 DIAGNOSIS — J45909 Unspecified asthma, uncomplicated: Secondary | ICD-10-CM | POA: Insufficient documentation

## 2018-02-20 DIAGNOSIS — Z791 Long term (current) use of non-steroidal anti-inflammatories (NSAID): Secondary | ICD-10-CM | POA: Insufficient documentation

## 2018-02-20 DIAGNOSIS — R21 Rash and other nonspecific skin eruption: Secondary | ICD-10-CM | POA: Insufficient documentation

## 2018-02-20 LAB — POCT RAPID STREP A
Streptococcus, Group A Screen (Direct): NEGATIVE
Streptococcus, Group A Screen (Direct): NEGATIVE

## 2018-02-20 MED ORDER — FLUCONAZOLE 150 MG PO TABS: 150.0000 mg | ORAL_TABLET | ORAL | 0 refills | Status: AC

## 2018-02-20 NOTE — Discharge Instructions (Signed)
Please take Diflucan once weekly for the next 6 weeks Please try to dry hands/feet well after showering/swimming Check an RPR to check for syphilis as cause of your rash  Please follow-up if symptoms not improving with treatment, spreading, changing, developing fever, involvement of mouth or vaginal area

## 2018-02-20 NOTE — ED Provider Notes (Signed)
Jane Rivers    CSN: [id number] Arrival date & time: 02/20/18  6213     History   Chief Complaint Chief Complaint  Patient presents with  . Rash    HPI Jane Rivers is a 27 y.o. female history of asthma, allergic rhinitis presenting today for evaluation of rash.  Patient states that for the past couple months she has noticed spots develop on her palms and soles of her hands and feet.  They have been slightly itchy, but otherwise asymptomatic.  They have not changed, but she has seen more develop over time.  Denies pain.  Denies close contacts with similar rash.  She has noticed a sore throat that has developed beginning today.  She denies any fevers, chills or night sweats.  Denies involvement of rash in mouth or vaginal mucosa.  She has tried eczema lotion to her feet without relief.  She was recently tested for syphilis in June which was nonreactive.  No new medications or other new exposures.  HPI  Past Medical History:  Diagnosis Date  . Abnormal Pap smear    never had  . Asthma   . Infection    chlamydia    Patient Active Problem List   Diagnosis Date Noted  . S/P primary low transverse C-section 07/23/2013  . HEADACHE 09/14/2009  . PLANTAR FASCIITIS 08/27/2009  . CONTACT DERMATITIS 06/16/2009  . LEUKOPENIA, CHRONIC 03/15/2009  . BACK PAIN, LUMBAR 02/18/2009  . IRON DEFICIENCY 11/20/2007  . PICA 11/20/2007  . ALLERGIC RHINITIS 10/11/2007  . ASTHMA 10/11/2007  . SEBORRHEIC DERMATITIS 10/11/2007  . UNSPECIFIED ALOPECIA 10/11/2007    Past Surgical History:  Procedure Laterality Date  . CESAREAN SECTION N/A 07/23/2013   Procedure: CESAREAN SECTION;  Surgeon: Delice Lesch, MD;  Location: [facility];  Service: Obstetrics;  Laterality: N/A;  . NO PAST SURGERIES      OB History    Gravida  1   Para  1   Term  1   Preterm      AB      Living  1     SAB      TAB      Ectopic      Multiple      Live Births  1            Home  Medications    Prior to Admission medications   Medication Sig Start Date End Date Taking? Authorizing Provider  fluconazole (DIFLUCAN) 150 MG tablet Take 1 tablet (150 mg total) by mouth once a week for 6 doses. 02/20/18 03/28/18  Noorah Giammona C, PA-C  ibuprofen (ADVIL,MOTRIN) 600 MG tablet Take 1 tablet (600 mg total) by mouth every 6 (six) hours. 07/26/13   Dione Booze, CNM    Family History Family History  Problem Relation Age of Onset  . Hypertension Maternal Aunt   . Hypertension Maternal Grandmother   . Hypertension Maternal Grandfather     Social History Social History   Tobacco Use  . Smoking status: Never Smoker  . Smokeless tobacco: Never Used  Substance Use Topics  . Alcohol use: No  . Drug use: Not Currently    Types: Marijuana    Comment: not since Jan     Allergies   Shellfish allergy   Review of Systems Review of Systems  Constitutional: Negative for fatigue and fever.  HENT: Negative for mouth sores.   Eyes: Negative for visual disturbance.  Respiratory: Negative for shortness of breath.  Cardiovascular: Negative for chest pain.  Gastrointestinal: Negative for abdominal pain, nausea and vomiting.  Genitourinary: Negative for genital sores.  Musculoskeletal: Negative for arthralgias and joint swelling.  Skin: Positive for color change and rash. Negative for wound.  Neurological: Negative for dizziness, weakness, light-headedness and headaches.     Physical Exam Triage Vital Signs ED Triage Vitals  Enc Vitals Group     BP 02/20/18 0837 109/65     Pulse Rate 02/20/18 0837 90     Resp 02/20/18 0837 18     Temp 02/20/18 0837 97.6 F (36.4 C)     Temp Source 02/20/18 0837 Oral     SpO2 02/20/18 0837 100 %     Weight --      Height --      Head Circumference --      Peak Flow --      Pain Score 02/20/18 0834 10     Pain Loc --      Pain Edu? --      Excl. in [place]? --    No data found.  Updated Vital Signs BP 109/65 (BP Location: Left  Arm)   Pulse 90   Temp 97.6 F (36.4 C) (Oral)   Resp 18   LMP 02/11/2018   SpO2 100%   Visual Acuity Right Eye Distance:   Left Eye Distance:   Bilateral Distance:    Right Eye Near:   Left Eye Near:    Bilateral Near:     Physical Exam  Constitutional: She is oriented to person, place, and time. She appears well-developed and well-nourished.  No acute distress  HENT:  Head: Normocephalic and atraumatic.  Nose: Nose normal.  Oral mucosa pink and moist, no tonsillar enlargement or exudate. Posterior pharynx patent and nonerythematous, no uvula deviation or swelling. Normal phonation.  No rash on oral mucosa  Eyes: Conjunctivae are normal.  Neck: Neck supple.  Cardiovascular: Normal rate.  Pulmonary/Chest: Effort normal. No respiratory distress.  Breathing comfortably at rest, CTABL, no wheezing, rales or other adventitious sounds auscultated  Abdominal: She exhibits no distension.  Musculoskeletal: Normal range of motion.  Neurological: She is alert and oriented to person, place, and time.  Skin: Skin is warm and dry.  Circular hyperpigmented lesions to palms bilaterally, similar hyperpigmented circular lesions to soles of feet with desquamation  Psychiatric: She has a normal mood and affect.  Nursing note and vitals reviewed.        UC Treatments / Results  Labs (all labs ordered are listed, but only abnormal results are displayed) Labs Reviewed  CULTURE, GROUP A STREP ([place])  RPR  POCT RAPID STREP A    EKG None  Radiology No results found.  Procedures Procedures (including critical care time)  Medications Ordered in UC Medications - No data to display  Initial Impression / Assessment and Plan / UC Course  I have reviewed the triage vital signs and the nursing notes.  Pertinent labs & imaging results that were available during my care of the patient were reviewed by me and considered in my medical decision making (see chart for details).      Possible fungal versus syphilis rash, RPR drawn.  Will treat for fungal with Diflucan weekly for 6 weeks.  Will call patient with results of RPR and provide treatment if needed.Discussed strict return precautions. Patient verbalized understanding and is agreeable with plan.  Final Clinical Impressions(s) / UC Diagnoses   Final diagnoses:  Rash and nonspecific skin eruption  Discharge Instructions     Please take Diflucan once weekly for the next 6 weeks Please try to dry hands/feet well after showering/swimming Check an RPR to check for syphilis as cause of your rash  Please follow-up if symptoms not improving with treatment, spreading, changing, developing fever, involvement of mouth or vaginal area   ED Prescriptions    Medication Sig Dispense Auth. Provider   fluconazole (DIFLUCAN) 150 MG tablet Take 1 tablet (150 mg total) by mouth once a week for 6 doses. 6 tablet Mikaelyn Arthurs, [place] C, PA-C     Controlled Substance Prescriptions [place] Controlled Substance Registry consulted? Not Applicable   Janith L[place], [place] 02/20/18 1007

## 2018-02-20 NOTE — ED Triage Notes (Signed)
Patient reports "spots" on palms and bottom of feet for a month and the areas are starting to itch.  Patient reports throat has been hurting when swallowing for 3 weeks

## 2018-02-21 LAB — RPR, QUANT+TP ABS (REFLEX)
Rapid Plasma Reagin, Quant: 1:16 {titer} — ABNORMAL HIGH
T Pallidum Abs: POSITIVE — AB

## 2018-02-21 LAB — RPR: RPR Ser Ql: REACTIVE — AB

## 2018-02-22 LAB — CULTURE, GROUP A STREP (THRC)

## 2018-02-25 ENCOUNTER — Telehealth (HOSPITAL_COMMUNITY): Payer: Self-pay

## 2018-02-25 ENCOUNTER — Telehealth (HOSPITAL_COMMUNITY): Payer: Self-pay | Admitting: Emergency Medicine

## 2018-02-25 ENCOUNTER — Ambulatory Visit (HOSPITAL_COMMUNITY)
Admission: EM | Admit: 2018-02-25 | Discharge: 2018-02-25 | Disposition: A | Discharge status: Home / Self Care | Payer: Medicaid Other | Attending: Family Medicine | Admitting: Family Medicine

## 2018-02-25 DIAGNOSIS — A539 Syphilis, unspecified: Secondary | ICD-10-CM

## 2018-02-25 MED ORDER — PENICILLIN G BENZATHINE 1200000 UNIT/2ML IM SUSP
2.4000 10*6.[IU] | Freq: Once | INTRAMUSCULAR | Status: AC
  Administered 2018-02-25: 2.4 10*6.[IU] via INTRAMUSCULAR

## 2018-02-25 MED ORDER — PENICILLIN G BENZATHINE 1200000 UNIT/2ML IM SUSP
INTRAMUSCULAR | Status: AC
  Filled 2018-02-25: qty 4

## 2018-02-25 NOTE — Telephone Encounter (Signed)
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

## 2018-02-25 NOTE — ED Notes (Signed)
Pt presents for treatment for syphillis Pt had call from [place] from 08/28 visit

## 2018-02-25 NOTE — Telephone Encounter (Signed)
Pt called back. Aware of results and need to come back to clinic for treatment.

## 2018-02-25 NOTE — Telephone Encounter (Signed)
Syphillis is positive. Pt was not treated at [facility] visit.  Pt should return for treatment. Attempted to reach patient. No answer at this time. Message sent to mychart.

## 2018-04-08 ENCOUNTER — Other Ambulatory Visit: Payer: Self-pay

## 2018-04-15 LAB — CYTOLOGY - PAP: Diagnosis: NEGATIVE

## 2020-03-05 ENCOUNTER — Other Ambulatory Visit: Payer: Medicaid Other

## 2020-03-05 ENCOUNTER — Other Ambulatory Visit: Payer: Self-pay | Admitting: Critical Care Medicine

## 2020-03-05 DIAGNOSIS — Z20822 Contact with and (suspected) exposure to covid-19: Secondary | ICD-10-CM

## 2020-03-08 LAB — NOVEL CORONAVIRUS, NAA: SARS-CoV-2, NAA: NOT DETECTED

## 2020-03-10 ENCOUNTER — Other Ambulatory Visit: Payer: Medicaid Other

## 2020-03-10 ENCOUNTER — Other Ambulatory Visit: Payer: Self-pay

## 2020-03-10 DIAGNOSIS — Z20822 Contact with and (suspected) exposure to covid-19: Secondary | ICD-10-CM

## 2020-03-12 LAB — NOVEL CORONAVIRUS, NAA: SARS-CoV-2, NAA: DETECTED — AB

## 2020-03-12 LAB — SARS-COV-2, NAA 2 DAY TAT

## 2020-03-13 ENCOUNTER — Telehealth (HOSPITAL_COMMUNITY): Payer: Self-pay | Admitting: Nurse Practitioner

## 2020-03-13 ENCOUNTER — Encounter: Payer: Self-pay | Admitting: Nurse Practitioner

## 2020-03-13 DIAGNOSIS — U071 COVID-19: Secondary | ICD-10-CM

## 2020-03-13 NOTE — Telephone Encounter (Signed)
Sent mychart message to patient to discuss covid symptoms and the use of regeneron, a monoclonal antibody infusion for those with mild to moderate Covid symptoms and at a high risk of hospitalization.     Pt is qualified for this infusion at the [facility] infusion center due to co-morbid conditions and/or a member of an at-risk group.    Beckey Rutter, [place], AGNP-C [phone] ([place])

## 2020-03-16 ENCOUNTER — Telehealth (HOSPITAL_COMMUNITY): Payer: Self-pay | Admitting: Nurse Practitioner

## 2020-03-16 NOTE — Telephone Encounter (Signed)
Called to discuss with Jane Rivers about Covid symptoms and the use of bamlanivimab, a monoclonal antibody infusion for those with mild to moderate Covid symptoms and at a high risk of hospitalization.    Pt does not qualify for infusion therapy as she  has asymptomatic infection. Isolation precautions discussed. Additionally she is not interested in receiving the infusion.      Patient Active Problem List   Diagnosis Date Noted  . S/P primary low transverse C-section 07/23/2013  . HEADACHE 09/14/2009  . PLANTAR FASCIITIS 08/27/2009  . CONTACT DERMATITIS 06/16/2009  . LEUKOPENIA, CHRONIC 03/15/2009  . BACK PAIN, LUMBAR 02/18/2009  . IRON DEFICIENCY 11/20/2007  . PICA 11/20/2007  . ALLERGIC RHINITIS 10/11/2007  . ASTHMA 10/11/2007  . SEBORRHEIC DERMATITIS 10/11/2007  . UNSPECIFIED ALOPECIA 10/11/2007     Jane Rivers, AGNP-C [facility]

## 2020-03-19 ENCOUNTER — Other Ambulatory Visit: Payer: Medicaid Other

## 2020-03-22 ENCOUNTER — Other Ambulatory Visit: Payer: Medicaid Other

## 2020-03-22 DIAGNOSIS — Z20822 Contact with and (suspected) exposure to covid-19: Secondary | ICD-10-CM

## 2020-03-24 LAB — NOVEL CORONAVIRUS, NAA: SARS-CoV-2, NAA: NOT DETECTED

## 2020-03-24 LAB — SARS-COV-2, NAA 2 DAY TAT

## 2020-06-26 NOTE — L&D Delivery Note (Signed)
Delivery Note Labor onset: 05/16/2021  Labor Onset Time: 0140 Complete dilation at 3:08 AM  Onset of pushing at 308 FHR second stage Cat 2 Analgesia/Anesthesia intrapartum: epidural  Guided pushing with maternal urge. Delivery of a viable female "Klover" at [phone]. Fetal head delivered in LOA position.  Nuchal cord: N/A.  Infant placed on maternal abd, dried, and tactile stim. Spontaneous cry Cord double clamped after pulsation ceased and cut by FOB.  3 Rns present for birth.  Cord blood sample collected: yes Arterial cord blood sample collected: N/A  Placenta delivered Jane Rivers via brandt andrews maneuver, intact, with 3 VC.  Placenta to L&D. Uterine tone firm, bleeding minimal  vaginal laceration identified.  Anesthesia: epidural Repair: 3-0 vicryl QBL/EBL (mL): 875 Complications: N/A APGAR: APGAR (1 MIN): 9   APGAR (5 MINS): 9   APGAR (10 MINS):   Mom to postpartum.  Baby to Couplet care / Skin to Skin.  [place] MSN, CNM 05/16/2021, 5:31 AM

## 2020-10-18 ENCOUNTER — Encounter (HOSPITAL_COMMUNITY): Payer: Self-pay | Admitting: Obstetrics and Gynecology

## 2020-10-18 ENCOUNTER — Other Ambulatory Visit: Payer: Self-pay

## 2020-10-18 ENCOUNTER — Inpatient Hospital Stay (HOSPITAL_COMMUNITY)
Admission: AD | Admit: 2020-10-18 | Discharge: 2020-10-18 | Disposition: A | Discharge status: Home / Self Care | Payer: Medicaid Other | Attending: Family Medicine | Admitting: Family Medicine

## 2020-10-18 ENCOUNTER — Inpatient Hospital Stay (HOSPITAL_COMMUNITY): Payer: Medicaid Other

## 2020-10-18 DIAGNOSIS — N939 Abnormal uterine and vaginal bleeding, unspecified: Secondary | ICD-10-CM

## 2020-10-18 DIAGNOSIS — Z3A09 9 weeks gestation of pregnancy: Secondary | ICD-10-CM

## 2020-10-18 DIAGNOSIS — O209 Hemorrhage in early pregnancy, unspecified: Secondary | ICD-10-CM | POA: Diagnosis present

## 2020-10-18 LAB — URINALYSIS, ROUTINE W REFLEX MICROSCOPIC
Bilirubin Urine: NEGATIVE
Glucose, UA: NEGATIVE mg/dL
Ketones, ur: NEGATIVE mg/dL
Leukocytes,Ua: NEGATIVE
Nitrite: NEGATIVE
Protein, ur: NEGATIVE mg/dL
Specific Gravity, Urine: 1.023 (ref 1.005–1.030)
pH: 5 (ref 5.0–8.0)

## 2020-10-18 LAB — CBC
HCT: 35.9 % — ABNORMAL LOW (ref 36.0–46.0)
Hemoglobin: 11.1 g/dL — ABNORMAL LOW (ref 12.0–15.0)
MCH: 23.7 pg — ABNORMAL LOW (ref 26.0–34.0)
MCHC: 30.9 g/dL (ref 30.0–36.0)
MCV: 76.7 fL — ABNORMAL LOW (ref 80.0–100.0)
Platelets: 197 10*3/uL (ref 150–400)
RBC: 4.68 MIL/uL (ref 3.87–5.11)
RDW: 16.4 % — ABNORMAL HIGH (ref 11.5–15.5)
WBC: 6.2 10*3/uL (ref 4.0–10.5)
nRBC: 0 % (ref 0.0–0.2)

## 2020-10-18 LAB — HCG, QUANTITATIVE, PREGNANCY: hCG, Beta Chain, Quant, S: 95077 m[IU]/mL — ABNORMAL HIGH (ref <5)

## 2020-10-18 MED ORDER — DOXYLAMINE-PYRIDOXINE 10-10 MG PO TBEC: 2.0000 | DELAYED_RELEASE_TABLET | Freq: Every day | ORAL | 5 refills | Status: DC

## 2020-10-18 NOTE — Discharge Instructions (Signed)
Vaginal Bleeding During Pregnancy, First Trimester A small amount of bleeding from the vagina is common during early pregnancy. This kind of bleeding is also called spotting. Sometimes the bleeding is normal and does not cause problems. At other times, though, bleeding may be a sign of something serious. Normal bleeding in pregnancy can happen:  When the fertilized egg attaches itself to your womb.  When blood vessels change because of the pregnancy.  When you have pelvic exams.  When you have sex. Abnormal bleeding can happen:  When you have an infection.  When you have growths in your womb. The growths are called polyps.  If you are having a miscarriage or at risk of having one.  If you have other problems in your pregnancy. Tell your doctor right away about any bleeding from your vagina. Follow these instructions at home: Watch your bleeding  Watch your condition for any changes. Let your doctor know if you are worried about something.  Try to know what causes your bleeding. Ask yourself these questions: ? Does the bleeding start on its own? ? Does the bleeding start after something is done, such as sex or a pelvic exam?  Use a diary to write the things you see about your bleeding. Write in your diary: ? If the bleeding flows freely without stopping, or if it starts and stops, and then starts again. ? If the bleeding is heavy or light. ? How many pads you use in a day and how much blood is in them.  Tell your doctor if you pass tissue. He or she may want to see it.   Activity  Follow your doctor's instructions about how active you can be. Ask what activities are safe for you.  Do not have sex or orgasms until your doctor says that this is safe.  If needed, make plans for someone to help with your normal activities. General instructions  Take over-the-counter and prescription medicines only as told by your doctor.  Do not take aspirin because it can cause  bleeding.  Do not use tampons.  Do not douche.  Keep all follow-up visits. Contact a doctor if:  You have vaginal bleeding at any time while you are pregnant.  You have cramps.  You have a fever or chills. Get help right away if:  You have very bad cramps in your back or belly (abdomen).  You pass large clots or a lot of tissue from your vagina.  Your bleeding gets worse.  You feel light-headed.  You feel weak.  You pass out (faint).  You have chills.  You are leaking fluid from your vagina.  You have a gush of fluid from your vagina. Summary  Sometimes vaginal bleeding during pregnancy is normal and does not cause problems. At other times, bleeding may be a sign of something serious.  Tell your doctor right away about any bleeding from your vagina.  Follow your doctor's instructions about how active you can be. You may need someone to help you with your normal activities.  Keep all follow-up visits. This information is not intended to replace advice given to you by your health care provider. Make sure you discuss any questions you have with your health care provider. Document Revised: 03/04/2020 Document Reviewed: 03/04/2020 Elsevier Patient Education  2021 Elsevier Inc.  

## 2020-10-18 NOTE — MAU Provider Note (Signed)
History     CSN: [id number]  Arrival date and time: 10/18/20 2353   Event Date/Time   First Provider Initiated Contact with Patient 10/18/20 [phone]      Chief Complaint  Patient presents with  . Vaginal Bleeding   HPI This is a 30yo G2P1001 at [redacted]w[redacted]d by LMP who presents with bleeding that started this AM. Bleeding is light and improving. No palliating or provoking factors.  OB History    Gravida  2   Para  1   Term  1   Preterm      AB      Living  1     SAB      IAB      Ectopic      Multiple      Live Births  1           Past Medical History:  Diagnosis Date  . Abnormal Pap smear    never had  . Asthma   . Infection    chlamydia    Past Surgical History:  Procedure Laterality Date  . CESAREAN SECTION N/A 07/23/2013   Procedure: CESAREAN SECTION;  Surgeon: Delice Lesch, MD;  Location: [facility];  Service: Obstetrics;  Laterality: N/A;  . NO PAST SURGERIES      Family History  Problem Relation Age of Onset  . Hypertension Maternal Aunt   . Hypertension Maternal Grandmother   . Hypertension Maternal Grandfather     Social History   Tobacco Use  . Smoking status: Never Smoker  . Smokeless tobacco: Never Used  Substance Use Topics  . Alcohol use: No  . Drug use: Not Currently    Types: Marijuana    Allergies:  Allergies  Allergen Reactions  . Shellfish Allergy Nausea And Vomiting    Medications Prior to Admission  Medication Sig Dispense Refill Last Dose  . ibuprofen (ADVIL,MOTRIN) 600 MG tablet Take 1 tablet (600 mg total) by mouth every 6 (six) hours. 30 tablet 1     Review of Systems  All other systems reviewed and are negative.  Physical Exam   Blood pressure 123/73, pulse 83, temperature 98.2 F (36.8 C), temperature source Oral, resp. rate 16, height 5\' 2"  (1.575 m), weight 97.6 kg, last menstrual period 08/13/2020, SpO2 100 %.  Physical Exam Vitals and nursing note reviewed.  Constitutional:      Appearance: Normal  appearance.  Cardiovascular:     Rate and Rhythm: Normal rate and regular rhythm.     Pulses: Normal pulses.     Heart sounds: Normal heart sounds.  Pulmonary:     Effort: Pulmonary effort is normal.     Breath sounds: Normal breath sounds.  Abdominal:     General: Abdomen is flat. There is no distension.     Palpations: Abdomen is soft.     Tenderness: There is no abdominal tenderness.  Skin:    General: Skin is warm and dry.     Capillary Refill: Capillary refill takes less than 2 seconds.  Neurological:     Mental Status: She is alert.  Psychiatric:        Mood and Affect: Mood normal.        Behavior: Behavior normal.        Thought Content: Thought content normal.        Judgment: Judgment normal.    No results found for this or any previous visit (from the past 24 hour(s)).  US OB Comp Less 14  Wks  Result Date: 10/18/2020 CLINICAL DATA:  Vaginal bleeding EXAM: OBSTETRIC <14 WK ULTRASOUND TECHNIQUE: Transabdominal ultrasound was performed for evaluation of the gestation as well as the maternal uterus and adnexal regions. COMPARISON:  None. FINDINGS: Intrauterine gestational sac: Visualized-single Yolk sac:  Visualized Embryo:  Visualized Cardiac Activity: Visualized Heart Rate: 171 bpm CRL:   33 mm   10 w 1 d                  [place] EDC: May 15, 2021 Subchorionic hemorrhage:  None visualized. Maternal uterus/adnexae: Within the uterus, there are decreased echogenicity masses consistent with leiomyomas measuring 4.2 x 3.4 x 4.0 cm and 3.7 x 3.2 x 3.4 cm respectively. Left ovary measures 3.2 x 2.3 x 2.4 cm. Right ovary not well seen. No extrauterine pelvic masses evident beyond physiologic corpus luteum on the left. No free pelvic fluid. IMPRESSION: Single live intrauterine gestation with estimated gestational age of [redacted] weeks. Intrauterine leiomyomas noted. No subchorionic hemorrhage. Right ovary not well seen.  Left ovary appears unremarkable. Electronically Signed   By: Lowella Grip III M.D.   On: 10/18/2020 08:47     MAU Course  Procedures  MDM  Assessment and Plan   1. [redacted] weeks gestation of pregnancy   2. Vaginal bleeding   3. Vaginal bleeding in pregnancy, first trimester    Viable IUP. Has appt with [facility]. Return with worsening bleeding.  Truett Mainland 10/18/2020, 8:50 AM

## 2020-10-18 NOTE — MAU Note (Addendum)
Jane Rivers is a 30 y.o. here in MAU reporting: has been to the HD to confirm pregnancy and states she is [redacted] weeks pregnant. Started having light bleeding this AM. Saw the bleeding when she wiped. no clots. No pain.    [facility] and had a + UPT. Pt was able to show RN pregnancy confirmation document on her phone through patient portal.   Onset of complaint: today  Pain score: 0/10  Vitals:   10/18/20 0725  BP: 127/80  Pulse: 83  Resp: 16  Temp: 98.2 F (36.8 C)  SpO2: 100%     Lab orders placed from triage: UA

## 2020-10-27 ENCOUNTER — Encounter: Payer: Medicaid Other | Admitting: Women's Health

## 2020-11-01 ENCOUNTER — Other Ambulatory Visit: Payer: Self-pay | Admitting: Obstetrics and Gynecology

## 2020-11-01 DIAGNOSIS — Z363 Encounter for antenatal screening for malformations: Secondary | ICD-10-CM

## 2020-12-22 ENCOUNTER — Encounter: Payer: Self-pay | Admitting: *Deleted

## 2020-12-22 ENCOUNTER — Ambulatory Visit (HOSPITAL_BASED_OUTPATIENT_CLINIC_OR_DEPARTMENT_OTHER): Payer: Medicaid Other | Admitting: Obstetrics and Gynecology

## 2020-12-22 ENCOUNTER — Other Ambulatory Visit: Payer: Self-pay | Admitting: *Deleted

## 2020-12-22 ENCOUNTER — Ambulatory Visit: Payer: Medicaid Other | Admitting: *Deleted

## 2020-12-22 ENCOUNTER — Ambulatory Visit: Payer: Medicaid Other | Attending: Obstetrics and Gynecology

## 2020-12-22 ENCOUNTER — Other Ambulatory Visit: Payer: Self-pay

## 2020-12-22 VITALS — BP 118/63 | HR 96

## 2020-12-22 DIAGNOSIS — D259 Leiomyoma of uterus, unspecified: Secondary | ICD-10-CM | POA: Insufficient documentation

## 2020-12-22 DIAGNOSIS — Z3689 Encounter for other specified antenatal screening: Secondary | ICD-10-CM | POA: Diagnosis present

## 2020-12-22 DIAGNOSIS — O358XX0 Maternal care for other (suspected) fetal abnormality and damage, not applicable or unspecified: Secondary | ICD-10-CM

## 2020-12-22 DIAGNOSIS — O99212 Obesity complicating pregnancy, second trimester: Secondary | ICD-10-CM | POA: Insufficient documentation

## 2020-12-22 DIAGNOSIS — O34219 Maternal care for unspecified type scar from previous cesarean delivery: Secondary | ICD-10-CM | POA: Diagnosis present

## 2020-12-22 DIAGNOSIS — O3412 Maternal care for benign tumor of corpus uteri, second trimester: Secondary | ICD-10-CM | POA: Diagnosis present

## 2020-12-22 DIAGNOSIS — Z363 Encounter for antenatal screening for malformations: Secondary | ICD-10-CM

## 2020-12-22 DIAGNOSIS — Z3A18 18 weeks gestation of pregnancy: Secondary | ICD-10-CM

## 2020-12-22 DIAGNOSIS — O321XX0 Maternal care for breech presentation, not applicable or unspecified: Secondary | ICD-10-CM

## 2020-12-22 DIAGNOSIS — E669 Obesity, unspecified: Secondary | ICD-10-CM

## 2020-12-22 DIAGNOSIS — Z6839 Body mass index (BMI) 39.0-39.9, adult: Secondary | ICD-10-CM

## 2020-12-22 NOTE — Progress Notes (Addendum)
Maternal-Fetal Medicine   Name: Jane Rivers DOB: [birth year] MRN: [id number] Referring Provider: Tania Ade, MD  I had the pleasure of [facility] [name] [facility]. She is G2 P1 at 18w 5d gestation and is here for fetal anatomy scan.  Patient reports she had low risk for fetal aneuploidies on cell free fetal DNA screening performed at your office.  We do not have the reports available with [place] now. She does not have diabetes or hypertension or any chronic medical conditions. Obstetric history significant for a term cesarean delivery of a female infant weighing 8 pounds and 4 ounces at birth.  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 Ultrasound We performed a fetal anatomical survey.  An echogenic intracardiac focus is seen.  No other markers of aneuploidies or fetal structural defects are seen.  Fetal biometry is consistent with the previously established dates.  Placenta is anterior and there is no evidence of previa or placenta accreta spectrum (PAS). To anterior intramural myomas are seen (measurements above). As maternal obesity imposes limitations on resolution of images, fetal anomalies may be missed.  I counseled the patient on the following Echogenic intracardiac focus This finding is usually seen in 3% to 4% of normal fetuses.  It is not associated with any cardiac defects.  Echogenic intracardiac focus is present in about 15% to 20% of Down syndrome fetuses.  However, given that she had low risk for Down syndrome on cell free fetal DNA screening this should be considered a normal variant. I reassured her of the findings.  I informed her that only amniocentesis will give a definitive result on the fetal karyotype.  I explained amniocentesis procedure and its possible complications including miscarriage (1 and 500 procedures). After counseling, the patient opted not to have amniocentesis.  Myomas in  pregnancy Myoma stenting increase in pregnancy and can rarely cause severe abdominal pain especially in the second trimester when they undergo degeneration.  Intramural myomas are less likely to cause second trimester miscarriages.  They can be associated with preterm delivery and postpartum hemorrhage.  Previous cesarean section I reassured the patient of placental position.  Patient was made aware that repeat cesarean deliveries increase the likelihood of previa or placenta accreta spectrum.  I briefly discussed VBAC and its complication (1% scar dehiscence).  Recommendations -An appointment was made for her to return in 4 weeks for completion of fetal anatomy. -Fetal growth assessment at 28 weeks and [redacted] weeks gestation. -VBAC discussion in the third trimester.  Thank you for consultation.  If you have any questions or concerns, please contact me the [facility].  Consultation including face-to-face counseling 30 minutes.

## 2021-01-26 ENCOUNTER — Encounter: Payer: Self-pay | Admitting: *Deleted

## 2021-01-26 ENCOUNTER — Ambulatory Visit: Payer: Medicaid Other | Attending: Obstetrics and Gynecology | Admitting: *Deleted

## 2021-01-26 ENCOUNTER — Other Ambulatory Visit: Payer: Self-pay

## 2021-01-26 ENCOUNTER — Ambulatory Visit: Payer: Medicaid Other

## 2021-01-26 ENCOUNTER — Ambulatory Visit (HOSPITAL_BASED_OUTPATIENT_CLINIC_OR_DEPARTMENT_OTHER): Payer: Medicaid Other

## 2021-01-26 VITALS — BP 121/67 | HR 96

## 2021-01-26 DIAGNOSIS — O99212 Obesity complicating pregnancy, second trimester: Secondary | ICD-10-CM | POA: Diagnosis present

## 2021-01-26 DIAGNOSIS — E669 Obesity, unspecified: Secondary | ICD-10-CM

## 2021-01-26 DIAGNOSIS — O34219 Maternal care for unspecified type scar from previous cesarean delivery: Secondary | ICD-10-CM

## 2021-01-26 DIAGNOSIS — O358XX0 Maternal care for other (suspected) fetal abnormality and damage, not applicable or unspecified: Secondary | ICD-10-CM

## 2021-01-26 DIAGNOSIS — Z3A23 23 weeks gestation of pregnancy: Secondary | ICD-10-CM

## 2021-01-26 DIAGNOSIS — Z363 Encounter for antenatal screening for malformations: Secondary | ICD-10-CM | POA: Diagnosis not present

## 2021-01-26 DIAGNOSIS — O3412 Maternal care for benign tumor of corpus uteri, second trimester: Secondary | ICD-10-CM | POA: Diagnosis not present

## 2021-01-26 DIAGNOSIS — Z6839 Body mass index (BMI) 39.0-39.9, adult: Secondary | ICD-10-CM

## 2021-01-26 DIAGNOSIS — D259 Leiomyoma of uterus, unspecified: Secondary | ICD-10-CM | POA: Insufficient documentation

## 2021-01-26 NOTE — Progress Notes (Signed)
Please do not discuss past medical hx in front of S.O.

## 2021-01-27 ENCOUNTER — Other Ambulatory Visit: Payer: Self-pay | Admitting: *Deleted

## 2021-01-27 DIAGNOSIS — D259 Leiomyoma of uterus, unspecified: Secondary | ICD-10-CM

## 2021-01-27 DIAGNOSIS — O3410 Maternal care for benign tumor of corpus uteri, unspecified trimester: Secondary | ICD-10-CM

## 2021-02-03 ENCOUNTER — Encounter (HOSPITAL_COMMUNITY): Payer: Self-pay | Admitting: Obstetrics and Gynecology

## 2021-02-03 ENCOUNTER — Other Ambulatory Visit: Payer: Self-pay

## 2021-02-03 ENCOUNTER — Inpatient Hospital Stay (HOSPITAL_COMMUNITY)
Admission: AD | Admit: 2021-02-03 | Discharge: 2021-02-03 | Disposition: A | Discharge status: Home / Self Care | Payer: Medicaid Other | Attending: Obstetrics and Gynecology | Admitting: Obstetrics and Gynecology

## 2021-02-03 DIAGNOSIS — O4702 False labor before 37 completed weeks of gestation, second trimester: Secondary | ICD-10-CM

## 2021-02-03 DIAGNOSIS — O4703 False labor before 37 completed weeks of gestation, third trimester: Secondary | ICD-10-CM | POA: Diagnosis present

## 2021-02-03 DIAGNOSIS — O479 False labor, unspecified: Secondary | ICD-10-CM

## 2021-02-03 DIAGNOSIS — Z3689 Encounter for other specified antenatal screening: Secondary | ICD-10-CM

## 2021-02-03 DIAGNOSIS — Z3A24 24 weeks gestation of pregnancy: Secondary | ICD-10-CM | POA: Diagnosis not present

## 2021-02-03 LAB — URINALYSIS, ROUTINE W REFLEX MICROSCOPIC
Bacteria, UA: NONE SEEN
Bilirubin Urine: NEGATIVE
Glucose, UA: NEGATIVE mg/dL
Hgb urine dipstick: NEGATIVE
Ketones, ur: 5 mg/dL — AB
Nitrite: NEGATIVE
Protein, ur: 30 mg/dL — AB
Specific Gravity, Urine: 1.033 — ABNORMAL HIGH (ref 1.005–1.030)
pH: 5 (ref 5.0–8.0)

## 2021-02-03 NOTE — MAU Provider Note (Signed)
Event Date/Time   First Provider Initiated Contact with Patient 02/03/21 1415     S Ms. Sheryn Acebo is a 30 y.o. G2P1001 pregnant female at 75w6dwho presents to MAU today with complaint of mild low back and abdominal pains that come and go. She woke up around 2-2:30am to use the restroom and noticed the pain. Denies vaginal bleeding/discharge and recently had swabs done at her OB office (all negative per pt). Worked yesterday doing [employer], had one bottle of water all day. Feeling regular and vigorous fetal movement. No other physical symptoms/complaints.  [facility]  Pertinent items noted in HPI and remainder of comprehensive ROS otherwise negative.  O BP 118/65 (BP Location: Right Arm)   Pulse (!) 109   Temp 98.4 F (36.9 C) (Oral)   Resp 15   Ht '5\' 2"'$  (1.575 m)   Wt 209 lb 3.2 oz (94.9 kg)   LMP 08/13/2020   SpO2 100%   BMI 38.26 kg/m  Physical Exam  Dilation: Closed Effacement (%): Thick Cervical Position: Posterior Station: Ballotable Presentation: Undeterminable Exam by:: JGaylan Gerold CNM  Fetal Tracing: reactive Baseline: 135 Variability: moderate Accelerations: 10x10 (appropriate for gestational age) Decelerations: none Toco: UI to relaxed  Oral hydration encouraged/provided, pt stated she was feeling better and BH had eased somewhat.   Encouraged her to stay hydrated/well-nourished and discussed relationship between increased BRichtonand dehydration/exhaustion/hunger. Pt expressed understanding.  A Braxton Hicks contractions NST reactive  P Discharge from MAU in stable condition with 2nd trimester precautions [facility]  WGabriel Carina CNM 02/03/2021 2:25 PM

## 2021-02-03 NOTE — MAU Note (Signed)
.  Jane Rivers is a 30 y.o. at 58w6dhere in MAU reporting: lower back and abdominal pain that started last night. She states it is a 7/10 and feels like pressure. Denies VB or LOF. Endorses good fetal movement.   Pain score: 7 Vitals:   02/03/21 1345  BP: 118/65  Pulse: (!) 109  Resp: 15  Temp: 98.4 F (36.9 C)  SpO2: 100%     FHT:143 Lab orders placed from triage:  UA

## 2021-02-14 ENCOUNTER — Other Ambulatory Visit: Payer: Self-pay

## 2021-02-14 ENCOUNTER — Encounter (HOSPITAL_COMMUNITY): Payer: Self-pay | Admitting: Obstetrics and Gynecology

## 2021-02-14 ENCOUNTER — Inpatient Hospital Stay (HOSPITAL_COMMUNITY)
Admission: AD | Admit: 2021-02-14 | Discharge: 2021-02-15 | Disposition: A | Discharge status: Home / Self Care | Payer: Medicaid Other | Attending: Obstetrics and Gynecology | Admitting: Obstetrics and Gynecology

## 2021-02-14 DIAGNOSIS — O99512 Diseases of the respiratory system complicating pregnancy, second trimester: Secondary | ICD-10-CM | POA: Diagnosis not present

## 2021-02-14 DIAGNOSIS — Z3A26 26 weeks gestation of pregnancy: Secondary | ICD-10-CM

## 2021-02-14 DIAGNOSIS — J029 Acute pharyngitis, unspecified: Secondary | ICD-10-CM | POA: Insufficient documentation

## 2021-02-14 DIAGNOSIS — Z20822 Contact with and (suspected) exposure to covid-19: Secondary | ICD-10-CM | POA: Diagnosis not present

## 2021-02-14 DIAGNOSIS — R059 Cough, unspecified: Secondary | ICD-10-CM | POA: Diagnosis present

## 2021-02-14 DIAGNOSIS — J069 Acute upper respiratory infection, unspecified: Secondary | ICD-10-CM

## 2021-02-14 MED ORDER — GUAIFENESIN ER 600 MG PO TB12: 600.0000 mg | ORAL_TABLET | Freq: Two times a day (BID) | ORAL | 0 refills | Status: DC | PRN

## 2021-02-14 MED ORDER — GUAIFENESIN ER 600 MG PO TB12
600.0000 mg | ORAL_TABLET | Freq: Once | ORAL | Status: AC
  Administered 2021-02-15: 600 mg via ORAL
  Filled 2021-02-14: qty 1

## 2021-02-14 NOTE — MAU Provider Note (Addendum)
None     Chief Complaint:  Nasal Congestion, Cough, and Sore Throat   Jane Rivers is  30 y.o. G2P1001 at 56w3dpresents complaining of Nasal Congestion, Cough, and Sore Throat    She began having covid symptoms last week (started 02/08/21) and came in today because she saw a COVID ticktoc and thought she needed to come in and get tested. Reports loss of smell and taste, congestion, sore throat, and fever. Now her sx are non productive cough, still can't smell or taste like normal. +FM. Denies contractions or leaking of fluid.      Vitals:    02/14/21 2223  BP: 109/64  Pulse: 99  Resp: 16  Temp: 98.4 F (36.9 C)  SpO2: 100%     [id number]  Obstetrical/Gynecological History: OB History     Gravida  2   Para  1   Term  1   Preterm      AB      Living  1      SAB      IAB      Ectopic      Multiple      Live Births  1          Past Medical History: Past Medical History:  Diagnosis Date   Abnormal Pap smear    never had   Asthma    Chlamydia    HSV (herpes simplex virus) infection    Infection    chlamydia   Syphilis     Past Surgical History: Past Surgical History:  Procedure Laterality Date   CESAREAN SECTION N/A 07/23/2013   Procedure: CESAREAN SECTION;  Surgeon: ADelice Lesch MD;  [facility];  Service: Obstetrics;  Laterality: N/A;   NO PAST SURGERIES      Family History: Family History  Problem Relation Age of Onset   Hypertension Maternal Aunt    Hypertension Maternal Grandmother    Hypertension Maternal Grandfather     Social History: Social History   Tobacco Use   Smoking status: Never   Smokeless tobacco: Never  Vaping Use   Vaping Use: Never used  Substance Use Topics   Alcohol use: No   Drug use: Not Currently    Types: Marijuana    Allergies:  Allergies  Allergen Reactions   Shellfish Allergy Nausea And Vomiting    Meds:  Medications Prior to Admission  Medication Sig Dispense  Refill Last Dose   Prenatal Vit-Fe Fumarate-FA (PRENATAL MULTIVITAMIN) TABS tablet Take 1 tablet by mouth daily at 12 noon.   02/14/2021    Review of Systems   Constitutional: Negative for chills Eyes: Negative for visual disturbances Respiratory: Negative for shortness of breath, dyspnea Cardiovascular: Negative for chest pain or palpitations  Gastrointestinal: Negative for vomiting, diarrhea and constipation Genitourinary: Negative for dysuria and urgency Musculoskeletal: Negative for back pain, joint pain  Normal ROM  Neurological: Negative for dizziness and headaches    Physical Exam  Blood pressure 109/64, pulse 99, temperature 98.4 F (36.9 C), temperature source Oral, resp. rate 16, last menstrual period 08/13/2020, SpO2 100 %. GENERAL: Well-developed, well-nourished female in no acute distress.  LUNGS: Normal respiratory effort, non productive cough. CTAB HEART: Regular rate and rhythm. ABDOMEN: Soft, nontender, nondistended, gravid. FHR 154 EXTREMITIES: Nontender, no edema, 2+ distal pulses. DTR's 2+   Labs: No results found for this or any previous visit (from the past 24 hour(s)). Imaging Studies:    Assessment: Jane Rivers is  29  y.o. G2P1001 at 4w3dpresents with URI/Covid sx X 6 days.  Plan: Covid test Rx Mucinex (give dose here)  FChristin Fudge8/22/202211:32 PM

## 2021-02-14 NOTE — MAU Note (Signed)
..  Jane Rivers is a 30 y.o. at 84w3dhere in MAU reporting: She began having covid symptoms last week and came in today because she saw a COVID ticktoc and thought she needed to come in and get tested. Reports loss of smell and taste, congestion, sore throat, and fever. +FM. Denies contractions or leaking of fluid.  Onset of complaint: Tuesday 02/09/2021 Pain score: 7/10 Vitals:   02/14/21 2223  BP: 109/64  Pulse: 99  Resp: 16  Temp: 98.4 F (36.9 C)  SpO2: 100%     [id number] 154

## 2021-02-15 LAB — SARS CORONAVIRUS 2 (TAT 6-24 HRS): SARS Coronavirus 2: NEGATIVE

## 2021-02-17 ENCOUNTER — Inpatient Hospital Stay (HOSPITAL_COMMUNITY)
Admission: AD | Admit: 2021-02-17 | Discharge: 2021-02-17 | Disposition: A | Discharge status: Home / Self Care | Payer: Medicaid Other | Attending: Obstetrics and Gynecology | Admitting: Obstetrics and Gynecology

## 2021-02-17 ENCOUNTER — Encounter (HOSPITAL_COMMUNITY): Payer: Self-pay | Admitting: Obstetrics and Gynecology

## 2021-02-17 ENCOUNTER — Other Ambulatory Visit: Payer: Self-pay

## 2021-02-17 DIAGNOSIS — R102 Pelvic and perineal pain: Secondary | ICD-10-CM | POA: Diagnosis not present

## 2021-02-17 DIAGNOSIS — O368120 Decreased fetal movements, second trimester, not applicable or unspecified: Secondary | ICD-10-CM

## 2021-02-17 DIAGNOSIS — R109 Unspecified abdominal pain: Secondary | ICD-10-CM | POA: Insufficient documentation

## 2021-02-17 DIAGNOSIS — O26899 Other specified pregnancy related conditions, unspecified trimester: Secondary | ICD-10-CM

## 2021-02-17 DIAGNOSIS — O26892 Other specified pregnancy related conditions, second trimester: Secondary | ICD-10-CM | POA: Diagnosis not present

## 2021-02-17 DIAGNOSIS — Z3A26 26 weeks gestation of pregnancy: Secondary | ICD-10-CM | POA: Diagnosis not present

## 2021-02-17 DIAGNOSIS — Z79899 Other long term (current) drug therapy: Secondary | ICD-10-CM | POA: Insufficient documentation

## 2021-02-17 LAB — URINALYSIS, ROUTINE W REFLEX MICROSCOPIC
Bacteria, UA: NONE SEEN
Bilirubin Urine: NEGATIVE
Glucose, UA: NEGATIVE mg/dL
Hgb urine dipstick: NEGATIVE
Ketones, ur: 20 mg/dL — AB
Nitrite: NEGATIVE
Protein, ur: NEGATIVE mg/dL
Specific Gravity, Urine: 1.019 (ref 1.005–1.030)
pH: 6 (ref 5.0–8.0)

## 2021-02-17 LAB — FETAL FIBRONECTIN: Fetal Fibronectin: NEGATIVE

## 2021-02-17 NOTE — MAU Note (Signed)
Pt reports she has had cramping on and off for a few hours.also c/o not feeling the baby move for a few hours. Denies any vag bleeding or discharge.

## 2021-02-17 NOTE — MAU Provider Note (Signed)
Chief Complaint:  Abdominal Pain and Decreased Fetal Movement   Event Date/Time   First Provider Initiated Contact with Patient 02/17/21 0222     HPI: Jane Rivers is a 30 y.o. G2P1001 at 61w6dho presents to maternity admissions reporting cramping and decreased fetal movement.   She denies LOF, vaginal bleeding, vaginal itching/burning, urinary symptoms, h/a, dizziness, n/v, diarrhea, constipation or fever/chills.    Abdominal Pain This is a new problem. The current episode started today. The onset quality is gradual. The problem occurs intermittently. The problem has been rapidly improving. The patient is experiencing no pain (Has had no cramps while here). The quality of the pain is cramping. The abdominal pain does not radiate. Pertinent negatives include no constipation, diarrhea, dysuria, fever, frequency or headaches. Nothing aggravates the pain. The pain is relieved by Nothing. She has tried nothing for the symptoms.  RN Note: Pt reports she has had cramping on and off for a few hours.also c/o not feeling the baby move for a few hours. Denies any vag bleeding or discharge  Past Medical History: Past Medical History:  Diagnosis Date   Abnormal Pap smear    never had   Asthma    Chlamydia    HSV (herpes simplex virus) infection    Infection    chlamydia   Syphilis     Past obstetric history: OB History  Gravida Para Term Preterm AB Living  '2 1 1     1  '$ SAB IAB Ectopic Multiple Live Births          1    # Outcome Date GA Lbr Len/2nd Weight Sex Delivery Anes PTL Lv  2 Current           1 Term 07/23/13 [id number] g F CS-LTranv Spinal  LIV    Past Surgical History: Past Surgical History:  Procedure Laterality Date   CESAREAN SECTION N/A 07/23/2013   Procedure: CESAREAN SECTION;  Surgeon: AnDelice LeschMD;  [facility];  Service: Obstetrics;  Laterality: N/A;   NO PAST SURGERIES      Family History: Family History  Problem Relation Age of Onset    Hypertension Maternal Aunt    Hypertension Maternal Grandmother    Hypertension Maternal Grandfather     Social History: Social History   Tobacco Use   Smoking status: Never   Smokeless tobacco: Never  Vaping Use   Vaping Use: Never used  Substance Use Topics   Alcohol use: No   Drug use: Not Currently    Types: Marijuana    Allergies:  Allergies  Allergen Reactions   Shellfish Allergy Nausea And Vomiting    Meds:  Medications Prior to Admission  Medication Sig Dispense Refill Last Dose   Prenatal Vit-Fe Fumarate-FA (PRENATAL MULTIVITAMIN) TABS tablet Take 1 tablet by mouth daily at 12 noon.   02/16/2021   guaiFENesin (MUCINEX) 600 MG 12 hr tablet Take 1 tablet (600 mg total) by mouth 2 (two) times daily as needed. 20 tablet 0     I have reviewed patient's Past Medical Hx, Surgical Hx, Family Hx, Social Hx, medications and allergies.   ROS:  Review of Systems  Constitutional:  Negative for fever.  Gastrointestinal:  Positive for abdominal pain. Negative for constipation and diarrhea.  Genitourinary:  Negative for dysuria and frequency.  Neurological:  Negative for headaches.  Other systems negative  Physical Exam  Patient Vitals for the past 24 hrs:  BP Temp Pulse Resp Height Weight  02/17/21 0154  130/70 98.4 F (36.9 C) 87 18 '5\' 2"'$  (1.575 m) 93.4 kg   Constitutional: Well-developed, well-nourished female in no acute distress.  Cardiovascular: normal rate and rhythm Respiratory: normal effort, clear to auscultation bilaterally GI: Abd soft, non-tender, gravid appropriate for gestational age.   No rebound or guarding. MS: Extremities nontender, no edema, normal ROM Neurologic: Alert and oriented x 4.  GU: Neg CVAT.  PELVIC EXAM:   Dilation: Closed Effacement (%): Thick Cervical Position: Posterior Exam by:: M.Remmington Urieta,CNM  FHT:  Baseline 140 , moderate variability, accelerations present, no decelerations  reassuring for gestational age Contractions: None  while here   Labs: Results for orders placed or performed during the hospital encounter of 02/17/21 (from the past 24 hour(s))  Urinalysis, Routine w reflex microscopic Urine, Clean Catch     Status: Abnormal   Collection Time: 02/17/21  1:58 AM  Result Value Ref Range   Color, Urine YELLOW YELLOW   APPearance CLEAR CLEAR   Specific Gravity, Urine 1.019 1.005 - 1.030   pH 6.0 5.0 - 8.0   Glucose, UA NEGATIVE NEGATIVE mg/dL   Hgb urine dipstick NEGATIVE NEGATIVE   Bilirubin Urine NEGATIVE NEGATIVE   Ketones, ur 20 (A) NEGATIVE mg/dL   Protein, ur NEGATIVE NEGATIVE mg/dL   Nitrite NEGATIVE NEGATIVE   Leukocytes,Ua TRACE (A) NEGATIVE   RBC / HPF 0-5 0 - 5 RBC/hpf   WBC, UA 0-5 0 - 5 WBC/hpf   Bacteria, UA NONE SEEN NONE SEEN   Squamous Epithelial / LPF 0-5 0 - 5   Mucus PRESENT       Imaging:    MAU Course/MDM: I have ordered labs and reviewed results. UA is mostly normal  NST reviewed, reassuring for GA Has had no cramping the entire time she was here Still doesn't feel much movement, only once.   Discussued it could be positional   Treatments in MAU included EFM.    Assessment: Single IUP at 27w6dPelvic cramping, resolved Decreased fetal movement, with reassuring FHR tracing  Plan: Discharge home Preterm Labor precautions and fetal kick counts Follow up in Office for prenatal visits  Encouraged to return if she develops worsening of symptoms, increase in pain, fever, or other concerning symptoms.  Pt stable at time of discharge.  MHansel FeinsteinCNM, MSN Certified Nurse-Midwife 02/17/2021 2:28 AM

## 2021-02-24 ENCOUNTER — Encounter (HOSPITAL_COMMUNITY): Payer: Self-pay | Admitting: Obstetrics & Gynecology

## 2021-02-24 ENCOUNTER — Inpatient Hospital Stay (HOSPITAL_COMMUNITY)
Admission: AD | Admit: 2021-02-24 | Discharge: 2021-02-24 | Disposition: A | Discharge status: Home / Self Care | Payer: Medicaid Other | Attending: Obstetrics & Gynecology | Admitting: Obstetrics & Gynecology

## 2021-02-24 ENCOUNTER — Inpatient Hospital Stay (HOSPITAL_COMMUNITY): Payer: Medicaid Other

## 2021-02-24 ENCOUNTER — Other Ambulatory Visit: Payer: Self-pay

## 2021-02-24 DIAGNOSIS — Z3689 Encounter for other specified antenatal screening: Secondary | ICD-10-CM | POA: Insufficient documentation

## 2021-02-24 DIAGNOSIS — R109 Unspecified abdominal pain: Secondary | ICD-10-CM | POA: Diagnosis not present

## 2021-02-24 DIAGNOSIS — Z3A27 27 weeks gestation of pregnancy: Secondary | ICD-10-CM | POA: Diagnosis not present

## 2021-02-24 DIAGNOSIS — R1011 Right upper quadrant pain: Secondary | ICD-10-CM | POA: Diagnosis not present

## 2021-02-24 DIAGNOSIS — O26892 Other specified pregnancy related conditions, second trimester: Secondary | ICD-10-CM | POA: Diagnosis present

## 2021-02-24 DIAGNOSIS — O26899 Other specified pregnancy related conditions, unspecified trimester: Secondary | ICD-10-CM

## 2021-02-24 LAB — URINALYSIS, ROUTINE W REFLEX MICROSCOPIC
Bilirubin Urine: NEGATIVE
Glucose, UA: NEGATIVE mg/dL
Hgb urine dipstick: NEGATIVE
Ketones, ur: 20 mg/dL — AB
Nitrite: NEGATIVE
Protein, ur: NEGATIVE mg/dL
Specific Gravity, Urine: 1.023 (ref 1.005–1.030)
pH: 6 (ref 5.0–8.0)

## 2021-02-24 MED ORDER — CYCLOBENZAPRINE HCL 5 MG PO TABS
10.0000 mg | ORAL_TABLET | Freq: Once | ORAL | Status: AC
  Administered 2021-02-24: 10 mg via ORAL
  Filled 2021-02-24: qty 2

## 2021-02-24 MED ORDER — ACETAMINOPHEN 500 MG PO TABS
1000.0000 mg | ORAL_TABLET | Freq: Once | ORAL | Status: AC
  Administered 2021-02-24: 1000 mg via ORAL
  Filled 2021-02-24: qty 2

## 2021-02-24 NOTE — MAU Provider Note (Signed)
History     CSN: [id number]  Arrival date and time: 02/24/21 0426   Event Date/Time   First Provider Initiated Contact with Patient 02/24/21 [phone]      Chief Complaint  Patient presents with   Abdominal Pain    Pt here c/o of constant right abd pain X 2 days. Denies bleeding or leaking of fluid. Endorses Positive FM   Jane Rivers is a 30 y.o. G2P1001 at 58w6dwho [facility]  She presents today for Abdominal Pain.  She reports having right side abdominal pain that was initially intermittent, but is now constant.  She states the pain started Tuesday and became constant at Wednesday at 2pm.  She reports she is unable to describe the pain.  She states the pain is also in her lower abdomen and radiates to both sides.  She states the pain is not relieved or aggravated by any known factors. She rates the pain a 7-8/10. She reports taking tylenol without relief.  She denies bleeding or leaking of fluid and endorses Positive FM.  She reports sexual activity yesterday that was without pain or discomfort.    OB History     Gravida  2   Para  1   Term  1   Preterm      AB      Living  1      SAB      IAB      Ectopic      Multiple      Live Births  1           Past Medical History:  Diagnosis Date   Abnormal Pap smear    never had   Asthma    Chlamydia    HSV (herpes simplex virus) infection    Infection    chlamydia   Syphilis     Past Surgical History:  Procedure Laterality Date   CESAREAN SECTION N/A 07/23/2013   Procedure: CESAREAN SECTION;  Surgeon: ADelice Lesch MD;  [facility];  Service: Obstetrics;  Laterality: N/A;   NO PAST SURGERIES      Family History  Problem Relation Age of Onset   Hypertension Maternal Aunt    Hypertension Maternal Grandmother    Hypertension Maternal Grandfather     Social History   Tobacco Use   Smoking status: Never   Smokeless tobacco: Never  Vaping Use   Vaping Use: Never used  Substance  Use Topics   Alcohol use: No   Drug use: Not Currently    Types: Marijuana    Allergies:  Allergies  Allergen Reactions   Shellfish Allergy Nausea And Vomiting    Medications Prior to Admission  Medication Sig Dispense Refill Last Dose   guaiFENesin (MUCINEX) 600 MG 12 hr tablet Take 1 tablet (600 mg total) by mouth 2 (two) times daily as needed. 20 tablet 0    Prenatal Vit-Fe Fumarate-FA (PRENATAL MULTIVITAMIN) TABS tablet Take 1 tablet by mouth daily at 12 noon.       Review of Systems  Constitutional:  Negative for chills and fever.  Gastrointestinal:  Negative for abdominal pain, constipation, diarrhea and nausea.  Genitourinary:  Negative for difficulty urinating, dysuria, vaginal bleeding and vaginal discharge.  Musculoskeletal:  Negative for back pain.  Neurological:  Negative for dizziness, light-headedness and headaches.  Physical Exam   Last menstrual period 08/13/2020.  Physical Exam Vitals reviewed.  Constitutional:      Appearance: Normal appearance. She is  well-developed.  HENT:     Head: Normocephalic and atraumatic.  Eyes:     Conjunctiva/sclera: Conjunctivae normal.  Cardiovascular:     Rate and Rhythm: Normal rate and regular rhythm.  Pulmonary:     Effort: Pulmonary effort is normal. No respiratory distress.     Breath sounds: Normal breath sounds.  Abdominal:     General: Bowel sounds are normal.     Palpations: Abdomen is soft.     Tenderness: There is abdominal tenderness in the right upper quadrant, left upper quadrant and left lower quadrant. There is no right CVA tenderness or left CVA tenderness.  Musculoskeletal:     Cervical back: Normal range of motion.  Skin:    General: Skin is warm and dry.  Neurological:     Mental Status: She is alert and oriented to person, place, and time.  Psychiatric:        Mood and Affect: Mood normal.        Behavior: Behavior normal.        Thought Content: Thought content normal.    Fetal  Assessment 135 bpm, Mod Var, -Decels, +Accels Toco: 2 ctx graphed  MAU Course   Results for orders placed or performed during the hospital encounter of 02/24/21 (from the past 24 hour(s))  Urinalysis, Routine w reflex microscopic Urine, Clean Catch     Status: Abnormal   Collection Time: 02/24/21  6:00 AM  Result Value Ref Range   Color, Urine YELLOW YELLOW   APPearance CLEAR CLEAR   Specific Gravity, Urine 1.023 1.005 - 1.030   pH 6.0 5.0 - 8.0   Glucose, UA NEGATIVE NEGATIVE mg/dL   Hgb urine dipstick NEGATIVE NEGATIVE   Bilirubin Urine NEGATIVE NEGATIVE   Ketones, ur 20 (A) NEGATIVE mg/dL   Protein, ur NEGATIVE NEGATIVE mg/dL   Nitrite NEGATIVE NEGATIVE   Leukocytes,Ua MODERATE (A) NEGATIVE   RBC / HPF 0-5 0 - 5 RBC/hpf   WBC, UA 6-10 0 - 5 WBC/hpf   Bacteria, UA RARE (A) NONE SEEN   Squamous Epithelial / LPF 6-10 0 - 5   Mucus PRESENT    Hyaline Casts, UA PRESENT    US Abdomen Complete  Result Date: 02/24/2021 CLINICAL DATA:  Right upper quadrant pain.  [redacted] weeks pregnant. EXAM: ABDOMEN ULTRASOUND COMPLETE COMPARISON:  None. FINDINGS: Gallbladder: No gallstones or wall thickening visualized. No sonographic Murphy sign noted by sonographer. Common bile duct: Diameter: 3 mm Liver: No focal lesion identified. Within normal limits in parenchymal echogenicity. Portal vein is patent on color Doppler imaging with normal direction of blood flow towards the liver. IVC: No abnormality visualized. Pancreas: Visualized portion unremarkable. Spleen: Size and appearance within normal limits. Right Kidney: Length: 9.5 cm. Echogenicity within normal limits. No mass or hydronephrosis visualized. Left Kidney: Length: 12.0 cm. Echogenicity within normal limits. No mass or hydronephrosis visualized. Abdominal aorta: No aneurysm visualized. Other findings: None. IMPRESSION: Unremarkable exam. No findings to explain the patient's history of pain. Electronically Signed   By: Misty Stanley M.D.   On:  02/24/2021 06:16    MDM PE Labs: None EFM Abdominal US Pain Medication Assessment and Plan  30 year old G2P1001  SIUP at 27.6 weeks Cat I FT Abdominal Pain   -Reviewed POC -Patient declined STD and WP testing stating she recently received at [facility]. -Exam performed and findings discussed. -Reassured that cervix is closed. -Discussed sending for abdominal US considering tenderness location. -Patient without q/c. -Declines pain medication. -NST reactive. -Encouraged to  give UA sample.  -Will await results.    Maryann Conners MSN, CNM 02/24/2021, 5:17 AM   Reassessment (6:17 AM) -Patient requesting pain medication. -Will give flexeril and tylenol. -Awaiting [place] results. -UA collected and sent.   Reassessment (6:46 AM)  -Abdominal US returns unremarkable. -UA without significant findings.  -Provider to bedside to discuss results. -Patient reports improvement in pain with Flexeril dosing. -Instructed to keep next appt as scheduled: Sept 7 -Patient without questions or concerns. -Encouraged to call primary office or return to MAU if symptoms worsen or with the onset of new symptoms. -Discharged to home in stable condition.   Maryann Conners MSN, CNM Advanced Practice Provider, [facility]

## 2021-02-24 NOTE — Progress Notes (Signed)
Gavin Pound CNM in earlier to discuss test results and d/c plan. Written and verbal d/c instructions given and understanding voiced.

## 2021-03-02 ENCOUNTER — Encounter: Payer: Self-pay | Admitting: *Deleted

## 2021-03-02 ENCOUNTER — Ambulatory Visit: Payer: Medicaid Other | Admitting: *Deleted

## 2021-03-02 ENCOUNTER — Ambulatory Visit: Payer: Medicaid Other | Attending: Obstetrics and Gynecology

## 2021-03-02 ENCOUNTER — Other Ambulatory Visit: Payer: Self-pay | Admitting: *Deleted

## 2021-03-02 ENCOUNTER — Other Ambulatory Visit: Payer: Self-pay

## 2021-03-02 VITALS — BP 120/63 | HR 93

## 2021-03-02 DIAGNOSIS — O3410 Maternal care for benign tumor of corpus uteri, unspecified trimester: Secondary | ICD-10-CM | POA: Diagnosis present

## 2021-03-02 DIAGNOSIS — D219 Benign neoplasm of connective and other soft tissue, unspecified: Secondary | ICD-10-CM

## 2021-03-02 DIAGNOSIS — O283 Abnormal ultrasonic finding on antenatal screening of mother: Secondary | ICD-10-CM | POA: Insufficient documentation

## 2021-03-02 DIAGNOSIS — D259 Leiomyoma of uterus, unspecified: Secondary | ICD-10-CM | POA: Insufficient documentation

## 2021-03-02 DIAGNOSIS — O3413 Maternal care for benign tumor of corpus uteri, third trimester: Secondary | ICD-10-CM | POA: Diagnosis not present

## 2021-03-02 DIAGNOSIS — E669 Obesity, unspecified: Secondary | ICD-10-CM | POA: Diagnosis not present

## 2021-03-02 DIAGNOSIS — O99213 Obesity complicating pregnancy, third trimester: Secondary | ICD-10-CM | POA: Diagnosis not present

## 2021-03-02 DIAGNOSIS — Z3689 Encounter for other specified antenatal screening: Secondary | ICD-10-CM

## 2021-03-02 DIAGNOSIS — Z3A28 28 weeks gestation of pregnancy: Secondary | ICD-10-CM

## 2021-04-06 ENCOUNTER — Encounter: Payer: Self-pay | Admitting: *Deleted

## 2021-04-06 ENCOUNTER — Ambulatory Visit: Payer: Medicaid Other | Attending: Obstetrics and Gynecology | Admitting: *Deleted

## 2021-04-06 ENCOUNTER — Ambulatory Visit (HOSPITAL_BASED_OUTPATIENT_CLINIC_OR_DEPARTMENT_OTHER): Payer: Medicaid Other

## 2021-04-06 ENCOUNTER — Other Ambulatory Visit: Payer: Self-pay

## 2021-04-06 VITALS — BP 100/55 | HR 87

## 2021-04-06 DIAGNOSIS — Z3689 Encounter for other specified antenatal screening: Secondary | ICD-10-CM | POA: Insufficient documentation

## 2021-04-06 DIAGNOSIS — O99213 Obesity complicating pregnancy, third trimester: Secondary | ICD-10-CM | POA: Diagnosis not present

## 2021-04-06 DIAGNOSIS — O3413 Maternal care for benign tumor of corpus uteri, third trimester: Secondary | ICD-10-CM | POA: Diagnosis not present

## 2021-04-06 DIAGNOSIS — Z3A33 33 weeks gestation of pregnancy: Secondary | ICD-10-CM

## 2021-04-06 DIAGNOSIS — O358XX0 Maternal care for other (suspected) fetal abnormality and damage, not applicable or unspecified: Secondary | ICD-10-CM | POA: Diagnosis not present

## 2021-04-06 DIAGNOSIS — O34219 Maternal care for unspecified type scar from previous cesarean delivery: Secondary | ICD-10-CM | POA: Insufficient documentation

## 2021-04-06 DIAGNOSIS — D259 Leiomyoma of uterus, unspecified: Secondary | ICD-10-CM

## 2021-04-06 DIAGNOSIS — O283 Abnormal ultrasonic finding on antenatal screening of mother: Secondary | ICD-10-CM

## 2021-04-06 DIAGNOSIS — D219 Benign neoplasm of connective and other soft tissue, unspecified: Secondary | ICD-10-CM

## 2021-04-13 ENCOUNTER — Ambulatory Visit: Payer: Medicaid Other

## 2021-05-06 ENCOUNTER — Other Ambulatory Visit: Payer: Self-pay | Admitting: Certified Nurse Midwife

## 2021-05-06 DIAGNOSIS — Z363 Encounter for antenatal screening for malformations: Secondary | ICD-10-CM

## 2021-05-10 ENCOUNTER — Other Ambulatory Visit: Payer: Self-pay

## 2021-05-10 ENCOUNTER — Encounter: Payer: Self-pay | Admitting: *Deleted

## 2021-05-10 ENCOUNTER — Ambulatory Visit: Payer: Medicaid Other | Attending: Certified Nurse Midwife

## 2021-05-10 ENCOUNTER — Ambulatory Visit: Payer: Medicaid Other | Admitting: *Deleted

## 2021-05-10 DIAGNOSIS — Z363 Encounter for antenatal screening for malformations: Secondary | ICD-10-CM | POA: Diagnosis not present

## 2021-05-14 ENCOUNTER — Encounter (HOSPITAL_COMMUNITY): Payer: Self-pay | Admitting: Obstetrics & Gynecology

## 2021-05-14 ENCOUNTER — Inpatient Hospital Stay (EMERGENCY_DEPARTMENT_HOSPITAL)
Admission: AD | Admit: 2021-05-14 | Discharge: 2021-05-15 | Disposition: A | Discharge status: Home / Self Care | Payer: Medicaid Other | Source: Home / Self Care | Attending: Obstetrics & Gynecology | Admitting: Obstetrics & Gynecology

## 2021-05-14 ENCOUNTER — Other Ambulatory Visit: Payer: Self-pay

## 2021-05-14 DIAGNOSIS — Z3689 Encounter for other specified antenatal screening: Secondary | ICD-10-CM | POA: Insufficient documentation

## 2021-05-14 DIAGNOSIS — O471 False labor at or after 37 completed weeks of gestation: Secondary | ICD-10-CM

## 2021-05-14 DIAGNOSIS — Z3A39 39 weeks gestation of pregnancy: Secondary | ICD-10-CM

## 2021-05-14 NOTE — MAU Provider Note (Signed)
S: Ms. Jane Rivers is a 30 y.o. G2P1001 at [redacted]w[redacted]d  who presents to MAU today for labor evaluation.   Nurse reports patient with some questionable variables, but resolved with position change. States no cervical change.   Cervical exam by RN:  Dilation: 3.5 Effacement (%): 80 Station: -2 Presentation: Vertex Exam by:: Glenice Bow, rnc  Fetal Monitoring: Baseline: 125 Variability: Moderate Accelerations: 15x15 Decelerations: None Contractions: Irregular  MDM Discussed patient with RN. NST reviewed.   A: SIUP at [redacted]w[redacted]d  False labor Cat I FT  P: NST Reactive Discharge home Labor precautions and kick counts included in AVS Patient to follow-up with primary office as scheduled  Patient may return to MAU as needed or when in labor   Gavin Pound, [place] 05/14/2021 11:55 PM

## 2021-05-14 NOTE — MAU Note (Signed)
..  Jane Rivers is a 30 y.o. at [redacted]w[redacted]d here in MAU reporting: Reports contractions since last night that are 4-5 minutes apart. Had cervical exam this week and was 2cm. Denies leaking of fluid or vaginal bleeding. +FM GBS neg  Pain score: 7/10 Vitals:   05/14/21 2157  BP: 131/73  Pulse: 86  Resp: 18  Temp: 97.6 F (36.4 C)  SpO2: 99%     FHT: 130

## 2021-05-15 ENCOUNTER — Inpatient Hospital Stay (HOSPITAL_COMMUNITY)
Admission: AD | Admit: 2021-05-15 | Discharge: 2021-05-17 | DRG: 806 | Disposition: A | Discharge status: Home / Self Care | Payer: Medicaid Other | Attending: Obstetrics & Gynecology | Admitting: Obstetrics & Gynecology

## 2021-05-15 ENCOUNTER — Other Ambulatory Visit: Payer: Self-pay

## 2021-05-15 DIAGNOSIS — O9832 Other infections with a predominantly sexual mode of transmission complicating childbirth: Secondary | ICD-10-CM | POA: Diagnosis present

## 2021-05-15 DIAGNOSIS — O99214 Obesity complicating childbirth: Secondary | ICD-10-CM | POA: Diagnosis present

## 2021-05-15 DIAGNOSIS — O3413 Maternal care for benign tumor of corpus uteri, third trimester: Secondary | ICD-10-CM | POA: Diagnosis present

## 2021-05-15 DIAGNOSIS — O34219 Maternal care for unspecified type scar from previous cesarean delivery: Principal | ICD-10-CM | POA: Diagnosis present

## 2021-05-15 DIAGNOSIS — Z20822 Contact with and (suspected) exposure to covid-19: Secondary | ICD-10-CM | POA: Diagnosis present

## 2021-05-15 DIAGNOSIS — D259 Leiomyoma of uterus, unspecified: Secondary | ICD-10-CM | POA: Diagnosis present

## 2021-05-15 DIAGNOSIS — Z3A39 39 weeks gestation of pregnancy: Secondary | ICD-10-CM

## 2021-05-15 DIAGNOSIS — A6000 Herpesviral infection of urogenital system, unspecified: Secondary | ICD-10-CM | POA: Diagnosis present

## 2021-05-15 DIAGNOSIS — O26893 Other specified pregnancy related conditions, third trimester: Secondary | ICD-10-CM | POA: Diagnosis present

## 2021-05-15 DIAGNOSIS — O471 False labor at or after 37 completed weeks of gestation: Secondary | ICD-10-CM | POA: Diagnosis not present

## 2021-05-15 MED ORDER — FENTANYL CITRATE (PF) 100 MCG/2ML IJ SOLN
100.0000 ug | INTRAMUSCULAR | Status: DC | PRN
  Filled 2021-05-15: qty 2

## 2021-05-15 NOTE — MAU Note (Signed)
Pt states contractions got more intense around 1600, feels they are stronger and closer together. Pt planning a TOLAC. No bleeding, no LOF, endorses good FM. [facility], RN

## 2021-05-15 NOTE — H&P (Signed)
OB ADMISSION/ HISTORY & PHYSICAL:  Admission Date: 05/15/2021 10:57 PM  Admit Diagnosis: TOLAC  Jane Rivers is a 30 y.o. female G2P1001 [redacted]w[redacted]d presenting for contractions. Endorses active FM, denies LOF and vaginal bleeding. Ctx began @ 1600  History of current pregnancy: G2P1001   Primary OB Provider: [facility] Patient entered care with [facility] at 23.5 wks.  Transfer from [facility] 05/19/21 by [facility] 08/12/20 and congruent w/ 11.3 wk U/S.   Anatomy scan:  23.5 wks, complete w/ anterior placenta.   Antenatal testing: for growth started at 30 weeks d/t hx of syphilis this preganacy Last evaluation: 38.6  wks EFW 7lbs 9oz, 56%  Significant prenatal events:   TOLAC - C/S in 2019 for initial HSV outbreak Syphilis 01/27/21 tx during pregnancy. Titer 1:2 @ NOB, recheck @ 28 wks 1:16, indicative of re-infection. [facility] the [facility]. 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 Vit D deficiency Obesity BMI 38 Fibroids Patient Active Problem List   Diagnosis Date Noted   S/P primary low transverse C-section 07/23/2013   HEADACHE 09/14/2009   PLANTAR FASCIITIS 08/27/2009   CONTACT DERMATITIS 06/16/2009   LEUKOPENIA, CHRONIC 03/15/2009   BACK PAIN, LUMBAR 02/18/2009   IRON DEFICIENCY 11/20/2007   PICA 11/20/2007   ALLERGIC RHINITIS 10/11/2007   ASTHMA 10/11/2007   SEBORRHEIC DERMATITIS 10/11/2007   UNSPECIFIED ALOPECIA 10/11/2007    Prenatal Labs: ABO, Rh:  O positive Antibody:  Negative Rubella:   Immune RPR:   Positive ( see above) HBsAg:   Negative HIV:   NR GTT: 112 GBS:   Negative GC/CHL: Negative/negative Genetics: Low risk female, AFP normal Tdap/influenza vaccines: Declined   OB History  Gravida Para Term Preterm AB Living  2 1 1     1   SAB IAB Ectopic Multiple Live Births          1    # Outcome Date GA Lbr Len/2nd Weight Sex Delivery Anes PTL Lv  2 Current           1 Term 07/23/13 [redacted]w[redacted]d  3680 g F CS-LTranv Spinal  LIV    Medical / Surgical History: Past medical history:   Past Medical History:  Diagnosis Date   Abnormal Pap smear    never had   Asthma    Chlamydia    HSV (herpes simplex virus) infection    Infection    chlamydia   Syphilis     Past surgical history:  Past Surgical History:  Procedure Laterality Date   CESAREAN SECTION N/A 07/23/2013   Procedure: CESAREAN SECTION;  Surgeon: Delice Lesch, MD;  Location: [facility];  Service: Obstetrics;  Laterality: N/A;   NO PAST SURGERIES     Family History:  Family History  Problem Relation Age of Onset   Hypertension Maternal Aunt    Hypertension Maternal Grandmother    Hypertension Maternal Grandfather     Social History:  reports that she has never smoked. She has never used smokeless tobacco. She reports that she does not currently use drugs after having used the following drugs: Marijuana. She reports that she does not drink alcohol.  Allergies: Shellfish allergy   Current Medications at time of admission:  Prior to Admission medications   Medication Sig Start Date End Date Taking? Authorizing Provider  Prenatal Vit-Fe Fumarate-FA (PRENATAL MULTIVITAMIN) TABS tablet Take 1 tablet by mouth daily at 12 noon.   Yes [provider]  valACYclovir (VALTREX) 500 MG tablet Take 500 mg by mouth 2 (two) times daily.  Yes [provider]    Review of Systems: Constitutional: Negative   HENT: Negative   Eyes: Negative   Respiratory: Negative   Cardiovascular: Negative   Gastrointestinal: Negative  Genitourinary: negative for bloody show, negative for LOF   Musculoskeletal: Negative   Skin: Negative   Neurological: Negative   Endo/Heme/Allergies: Negative   Psychiatric/Behavioral: Negative    Physical Exam: VS: Pulse 76, resp. rate 17, height 5\' 2"  (1.575 m), weight 43.6 kg, last menstrual period 08/13/2020, SpO2 99 %. AAO x3, no signs of distress Cardiovascular: RRR Respiratory: Lung fields clear to ausculation GU/GI: Abdomen gravid, non-tender, non-distended,  active FM, vertex Extremities: negative edema, negative for pain, tenderness, and cords  Cervical exam:Dilation: 5 Effacement (%): 90 Station: -1 Exam by:: Conan Bowens, RN FHR: baseline rate 125 / variability moderate / accelerations present / absent decelerations TOCO: 2-4   Prenatal Transfer Tool  Maternal Diabetes: No Genetic Screening: Normal Maternal Ultrasounds/Referrals: Normal Fetal Ultrasounds or other Referrals:  Referred to Materal Fetal Medicine  Maternal Substance Abuse:  No Significant Maternal Medications:  Meds include: Other: valtrex Significant Maternal Lab Results: Group B Strep negative RPR positive - titer 1.16 @ 28 weeks. Tx at health department.     Assessment: 30 y.o. G2P1001 [redacted]w[redacted]d TOLAC, spontaneous labor  latent stage of labor FHR category 1 GBS negative Pain management plan: epidural prn   Plan:  Admit to L&D Routine admission orders Epidural PRN HSV hx- Speculum exam  Dr Alwyn Pea notified of admission and plan of care  Domingo Pulse MSN, CNM 05/15/2021 11:58 PM

## 2021-05-15 NOTE — MAU Note (Signed)
Reports contractions are more intense and back to back. Denies vaginal bleeding or leaking of fluid. +FM

## 2021-05-16 ENCOUNTER — Encounter (HOSPITAL_COMMUNITY): Payer: Self-pay | Admitting: Obstetrics & Gynecology

## 2021-05-16 ENCOUNTER — Inpatient Hospital Stay (HOSPITAL_COMMUNITY): Payer: Medicaid Other | Admitting: Anesthesiology

## 2021-05-16 ENCOUNTER — Other Ambulatory Visit: Payer: Self-pay

## 2021-05-16 LAB — CBC
HCT: 36.9 % (ref 36.0–46.0)
Hemoglobin: 11.6 g/dL — ABNORMAL LOW (ref 12.0–15.0)
MCH: 24.5 pg — ABNORMAL LOW (ref 26.0–34.0)
MCHC: 31.4 g/dL (ref 30.0–36.0)
MCV: 77.8 fL — ABNORMAL LOW (ref 80.0–100.0)
Platelets: 199 10*3/uL (ref 150–400)
RBC: 4.74 MIL/uL (ref 3.87–5.11)
RDW: 14.2 % (ref 11.5–15.5)
WBC: 6.9 10*3/uL (ref 4.0–10.5)
nRBC: 0 % (ref 0.0–0.2)

## 2021-05-16 LAB — TYPE AND SCREEN
ABO/RH(D): O POS
Antibody Screen: NEGATIVE

## 2021-05-16 LAB — RESP PANEL BY RT-PCR (FLU A&B, COVID) ARPGX2
Influenza A by PCR: NEGATIVE
Influenza B by PCR: NEGATIVE
SARS Coronavirus 2 by RT PCR: NEGATIVE

## 2021-05-16 LAB — RPR
RPR Ser Ql: REACTIVE — AB
RPR Titer: 1:2 {titer}

## 2021-05-16 LAB — HIV ANTIBODY (ROUTINE TESTING W REFLEX): HIV Screen 4th Generation wRfx: NONREACTIVE

## 2021-05-16 MED ORDER — OXYTOCIN-SODIUM CHLORIDE 30-0.9 UT/500ML-% IV SOLN
1.0000 m[IU]/min | INTRAVENOUS | Status: DC
  Administered 2021-05-16: 2 m[IU]/min via INTRAVENOUS

## 2021-05-16 MED ORDER — FENTANYL CITRATE (PF) 100 MCG/2ML IJ SOLN
50.0000 ug | INTRAMUSCULAR | Status: DC | PRN
  Administered 2021-05-16: 50 ug via INTRAVENOUS

## 2021-05-16 MED ORDER — SOD CITRATE-CITRIC ACID 500-334 MG/5ML PO SOLN: 30.0000 mL | ORAL | Status: DC | PRN

## 2021-05-16 MED ORDER — OXYCODONE HCL 5 MG PO TABS: 10.0000 mg | ORAL_TABLET | ORAL | Status: DC | PRN

## 2021-05-16 MED ORDER — DIPHENHYDRAMINE HCL 25 MG PO CAPS: 25.0000 mg | ORAL_CAPSULE | Freq: Four times a day (QID) | ORAL | Status: DC | PRN

## 2021-05-16 MED ORDER — PRENATAL MULTIVITAMIN CH
1.0000 | ORAL_TABLET | Freq: Every day | ORAL | Status: DC
  Administered 2021-05-16: 1 via ORAL
  Filled 2021-05-16: qty 1

## 2021-05-16 MED ORDER — PHENYLEPHRINE 40 MCG/ML (10ML) SYRINGE FOR IV PUSH (FOR BLOOD PRESSURE SUPPORT): 80.0000 ug | PREFILLED_SYRINGE | INTRAVENOUS | Status: DC | PRN

## 2021-05-16 MED ORDER — TERBUTALINE SULFATE 1 MG/ML IJ SOLN: 0.2500 mg | Freq: Once | INTRAMUSCULAR | Status: DC | PRN

## 2021-05-16 MED ORDER — OXYCODONE-ACETAMINOPHEN 5-325 MG PO TABS: 2.0000 | ORAL_TABLET | ORAL | Status: DC | PRN

## 2021-05-16 MED ORDER — BENZOCAINE-MENTHOL 20-0.5 % EX AERO: 1.0000 "application " | INHALATION_SPRAY | CUTANEOUS | Status: DC | PRN

## 2021-05-16 MED ORDER — TETANUS-DIPHTH-ACELL PERTUSSIS 5-2.5-18.5 LF-MCG/0.5 IM SUSY: 0.5000 mL | PREFILLED_SYRINGE | Freq: Once | INTRAMUSCULAR | Status: DC

## 2021-05-16 MED ORDER — SIMETHICONE 80 MG PO CHEW: 80.0000 mg | CHEWABLE_TABLET | ORAL | Status: DC | PRN

## 2021-05-16 MED ORDER — OXYTOCIN BOLUS FROM INFUSION
333.0000 mL | Freq: Once | INTRAVENOUS | Status: AC
  Administered 2021-05-16: 333 mL via INTRAVENOUS

## 2021-05-16 MED ORDER — ACETAMINOPHEN 325 MG PO TABS: 650.0000 mg | ORAL_TABLET | ORAL | Status: DC | PRN

## 2021-05-16 MED ORDER — EPHEDRINE 5 MG/ML INJ: 10.0000 mg | INTRAVENOUS | Status: DC | PRN

## 2021-05-16 MED ORDER — TRANEXAMIC ACID-NACL 1000-0.7 MG/100ML-% IV SOLN
1000.0000 mg | INTRAVENOUS | Status: AC
  Administered 2021-05-16: 1000 mg via INTRAVENOUS

## 2021-05-16 MED ORDER — FENTANYL-BUPIVACAINE-NACL 0.5-0.125-0.9 MG/250ML-% EP SOLN
EPIDURAL | Status: AC
  Filled 2021-05-16: qty 250

## 2021-05-16 MED ORDER — DIPHENHYDRAMINE HCL 50 MG/ML IJ SOLN: 12.5000 mg | INTRAMUSCULAR | Status: DC | PRN

## 2021-05-16 MED ORDER — ONDANSETRON HCL 4 MG/2ML IJ SOLN: 4.0000 mg | Freq: Four times a day (QID) | INTRAMUSCULAR | Status: DC | PRN

## 2021-05-16 MED ORDER — ONDANSETRON HCL 4 MG PO TABS: 4.0000 mg | ORAL_TABLET | ORAL | Status: DC | PRN

## 2021-05-16 MED ORDER — ONDANSETRON HCL 4 MG/2ML IJ SOLN: 4.0000 mg | INTRAMUSCULAR | Status: DC | PRN

## 2021-05-16 MED ORDER — COCONUT OIL OIL: 1.0000 "application " | TOPICAL_OIL | Status: DC | PRN

## 2021-05-16 MED ORDER — LIDOCAINE HCL (PF) 1 % IJ SOLN
INTRAMUSCULAR | Status: DC | PRN
  Administered 2021-05-16: 6 mL via EPIDURAL

## 2021-05-16 MED ORDER — SENNOSIDES-DOCUSATE SODIUM 8.6-50 MG PO TABS
2.0000 | ORAL_TABLET | Freq: Every day | ORAL | Status: DC
  Filled 2021-05-16: qty 2

## 2021-05-16 MED ORDER — OXYTOCIN-SODIUM CHLORIDE 30-0.9 UT/500ML-% IV SOLN
2.5000 [IU]/h | INTRAVENOUS | Status: DC
  Administered 2021-05-16: 2.5 [IU]/h via INTRAVENOUS
  Filled 2021-05-16: qty 500

## 2021-05-16 MED ORDER — LIDOCAINE HCL (PF) 1 % IJ SOLN: 30.0000 mL | INTRAMUSCULAR | Status: DC | PRN

## 2021-05-16 MED ORDER — OXYCODONE-ACETAMINOPHEN 5-325 MG PO TABS: 1.0000 | ORAL_TABLET | ORAL | Status: DC | PRN

## 2021-05-16 MED ORDER — TRANEXAMIC ACID-NACL 1000-0.7 MG/100ML-% IV SOLN
INTRAVENOUS | Status: AC
  Filled 2021-05-16: qty 100

## 2021-05-16 MED ORDER — LACTATED RINGERS IV SOLN: INTRAVENOUS | Status: DC

## 2021-05-16 MED ORDER — IBUPROFEN 600 MG PO TABS
600.0000 mg | ORAL_TABLET | Freq: Four times a day (QID) | ORAL | Status: DC
  Administered 2021-05-16 – 2021-05-17 (×2): 600 mg via ORAL
  Filled 2021-05-16 (×4): qty 1

## 2021-05-16 MED ORDER — LACTATED RINGERS IV SOLN: 500.0000 mL | INTRAVENOUS | Status: DC | PRN

## 2021-05-16 MED ORDER — ZOLPIDEM TARTRATE 5 MG PO TABS: 5.0000 mg | ORAL_TABLET | Freq: Every evening | ORAL | Status: DC | PRN

## 2021-05-16 MED ORDER — FENTANYL-BUPIVACAINE-NACL 0.5-0.125-0.9 MG/250ML-% EP SOLN
12.0000 mL/h | EPIDURAL | Status: DC | PRN
  Administered 2021-05-16: 12 mL/h via EPIDURAL

## 2021-05-16 MED ORDER — DIBUCAINE (PERIANAL) 1 % EX OINT: 1.0000 "application " | TOPICAL_OINTMENT | CUTANEOUS | Status: DC | PRN

## 2021-05-16 MED ORDER — WITCH HAZEL-GLYCERIN EX PADS: 1.0000 "application " | MEDICATED_PAD | CUTANEOUS | Status: DC | PRN

## 2021-05-16 MED ORDER — LACTATED RINGERS IV SOLN: 500.0000 mL | Freq: Once | INTRAVENOUS | Status: DC

## 2021-05-16 MED ORDER — OXYCODONE HCL 5 MG PO TABS: 5.0000 mg | ORAL_TABLET | ORAL | Status: DC | PRN

## 2021-05-16 NOTE — Anesthesia Postprocedure Evaluation (Signed)
Anesthesia Post Note  Patient: Jane Rivers  Procedure(s) Performed: AN AD HOC LABOR EPIDURAL     Patient location during evaluation: Mother Baby Anesthesia Type: Epidural Level of consciousness: awake and alert Pain management: pain level controlled Vital Signs Assessment: post-procedure vital signs reviewed and stable Respiratory status: spontaneous breathing, nonlabored ventilation and respiratory function stable Cardiovascular status: stable Postop Assessment: no headache, no backache and epidural receding Anesthetic complications: no   No notable events documented.  Last Vitals:  Vitals:   05/16/21 0813 05/16/21 1140  BP: 108/77 95/67  Pulse: 84 80  Resp: 17 16  Temp: 37 C 36.8 C  SpO2:      Last Pain:  Vitals:   05/16/21 1140  TempSrc: Oral  PainSc: 0-No pain   Pain Goal: Patients Stated Pain Goal: 0 (05/16/21 0051)                 Barkley Boards

## 2021-05-16 NOTE — Anesthesia Preprocedure Evaluation (Signed)
Anesthesia Evaluation  Patient identified by MRN, date of birth, ID band Patient awake    Reviewed: Allergy & Precautions, H&P , NPO status , Patient's Chart, lab work & pertinent test results  Airway Mallampati: II  TM Distance: >3 FB Neck ROM: Full    Dental no notable dental hx.    Pulmonary asthma ,    Pulmonary exam normal breath sounds clear to auscultation       Cardiovascular negative cardio ROS Normal cardiovascular exam Rhythm:Regular Rate:Normal     Neuro/Psych  Headaches, negative psych ROS   GI/Hepatic negative GI ROS, Neg liver ROS,   Endo/Other  negative endocrine ROS  Renal/GU negative Renal ROS  negative genitourinary   Musculoskeletal negative musculoskeletal ROS (+)   Abdominal   Peds negative pediatric ROS (+)  Hematology  (+) anemia ,   Anesthesia Other Findings   Reproductive/Obstetrics (+) Pregnancy                             Anesthesia Physical Anesthesia Plan  ASA: 2  Anesthesia Plan: Epidural   Post-op Pain Management:    Induction:   PONV Risk Score and Plan: 2 and Treatment may vary due to age or medical condition  Airway Management Planned: Natural Airway  Additional Equipment:   Intra-op Plan:   Post-operative Plan:   Informed Consent: I have reviewed the patients History and Physical, chart, labs and discussed the procedure including the risks, benefits and alternatives for the proposed anesthesia with the patient or authorized representative who has indicated his/her understanding and acceptance.       Plan Discussed with: CRNA and Anesthesiologist  Anesthesia Plan Comments:         Anesthesia Quick Evaluation

## 2021-05-16 NOTE — Lactation Note (Signed)
This note was copied from a baby's chart. Lactation Consultation Note  Patient Name: Jane Rivers MVEHM'C Date: 05/16/2021 Reason for consult: Follow-up assessment;Term;1st time breastfeeding Age:29 hours   P2 mother whose infant is now 20 hours old.  This is a term baby at 39+3 weeks.  Mother's current feeding preference is breast/formula.  She did not breast feed her first child.  Baby was swaddled and asleep on mother's chest when I arrived.  Reviewed breast feeding basics with mother and feeding cues.  Encouraged to feed 8-12 times/24 hours or sooner if baby shows cues.  Asked mother to call her RN/LC for latch assistance as needed.  Mother plans on attempting to latch within the next hour.  Mom made aware of O/P services, breastfeeding support groups, community resources, and our phone # for post-discharge questions.  Mother has a DEBP for home use.  No support person present at this time.   Maternal Data Has patient been taught Hand Expression?: Yes Does the patient have breastfeeding experience prior to this delivery?: No  Feeding Mother's Current Feeding Choice: Breast Milk and Formula Nipple Type: Slow - flow  LATCH Score                    Lactation Tools Discussed/Used    Interventions Interventions: Breast feeding basics reviewed;Education  Discharge Pump: Personal WIC Program: No  Consult Status Consult Status: Follow-up Date: 05/17/21 Follow-up type: In-patient    Dalila Arca R Juell Radney 05/16/2021, 11:57 AM

## 2021-05-16 NOTE — Progress Notes (Signed)
Jane Rivers is a 30 y.o. G2P1001 at [redacted]w[redacted]d by ultrasound admitted for  Spontaneous labor, TOLAC  Subjective:  Comfortable with epidural. Discussed AROM R/B/A with pt. Pt consented. AROM complete with small amount of clear fluid.   Objective: Vitals:   05/16/21 0208 05/16/21 0230 05/16/21 0335 05/16/21 0345  BP:  (!) 120/58 97/73   Pulse:  87 (!) 111   Resp:      Temp:    98 F (36.7 C)  TempSrc:    Axillary  SpO2: 100%     Weight:      Height:        No intake/output data recorded.    FHT:  FHR: 120 bpm, variability: moderate,  accelerations:  Present,  decelerations:  Present early, variable UC:   regular, every 2-6 minutes SVE:   Dilation: 10 Effacement (%): 100 Station: Plus 1 Exam by:: Renella Cunas, RN  Labs:   Recent Labs    05/16/21 0012  WBC 6.9  HGB 11.6*  HCT 36.9  PLT 199    Assessment / Plan: 30 y.o. AGP@ [redacted]w[redacted]d TOLAC, spontaneous labor  Labor:  progressing normally Preeclampsia:  no signs or symptoms of toxicity Fetal Wellbeing:  Category I Pain Control:  Epidural I/D:   GBS negative Anticipated MOD:  NSVD   Holli Humbles MSN, CNM 05/16/2021, 4:01 AM

## 2021-05-16 NOTE — Progress Notes (Signed)
Called to room for increased bleeding. Bleeding light, no clots. Fundus firm with massage. Able to palpate two fibroids superficial to fundus. Instructions to continue to monitor bleeding and call with any additional concerns.   Kathalene Frames, CNM

## 2021-05-16 NOTE — Plan of Care (Signed)

## 2021-05-16 NOTE — Progress Notes (Signed)
Subjective:    Speculum exam done. No lesions noted. Pt getting epidural.  Objective:    VS: Rivers 76   Resp 17   Ht 5\' 2"  (1.575 m)   Wt 43.6 kg   LMP 08/13/2020   SpO2 99%   BMI 17.56 kg/m  FHR : baseline 120 / variability moderate / accelerations present / occasional variable decelerations Toco: contractions every 2-4 minutes / Membranes: Intact Dilation: 5 Effacement (%): 90 Station: -1 Presentation: Vertex Exam by:: Conan Bowens, RN   Assessment/Plan:   30 y.o. G2P1001 [redacted]w[redacted]d admitted for spontaneous labor. TOLAC.  Labor:  progressing normally Preeclampsia:  no signs or symptoms of toxicity Fetal Wellbeing:  Category I Pain Control:  Epidural I/D:   GBS negative Anticipated MOD:  NSVD  Jane Pulse MSN, CNM 05/16/2021 1:08 AM

## 2021-05-16 NOTE — Plan of Care (Signed)

## 2021-05-16 NOTE — Anesthesia Procedure Notes (Signed)
Epidural Patient location during procedure: OB Start time: 05/16/2021 1:12 AM End time: 05/16/2021 1:22 AM  Staffing Anesthesiologist: Merlinda Frederick, MD Performed: anesthesiologist   Preanesthetic Checklist Completed: patient identified, IV checked, site marked, risks and benefits discussed, monitors and equipment checked, pre-op evaluation and timeout performed  Epidural Patient position: sitting Prep: DuraPrep Patient monitoring: heart rate, cardiac monitor, continuous pulse ox and blood pressure Approach: midline Location: L2-L3 Injection technique: LOR saline  Needle:  Needle type: Tuohy  Needle gauge: 17 G Needle length: 9 cm Needle insertion depth: 6 cm Catheter type: closed end flexible Catheter size: 20 Guage Catheter at skin depth: 11 cm Test dose: negative and Other  Assessment Events: blood not aspirated, injection not painful, no injection resistance and negative IV test  Additional Notes Informed consent obtained prior to proceeding including risk of failure, 1% risk of PDPH, risk of minor discomfort and bruising.  Discussed rare but serious complications including epidural abscess, permanent nerve injury, epidural hematoma.  Discussed alternatives to epidural analgesia and patient desires to proceed.  Timeout performed pre-procedure verifying patient name, procedure, and platelet count.  Patient tolerated procedure well.

## 2021-05-16 NOTE — Lactation Note (Signed)
This note was copied from a baby's chart. Lactation Consultation Note  Patient Name: Jane Rivers Date: 05/16/2021 Reason for consult: Initial assessment;Term Age:29 hours   LC Initial Consult:  Attempted to visit with family, however, all members are asleep at this time.  Will plan a return visit for later today.   Maternal Data    Feeding Mother's Current Feeding Choice: Breast Milk  LATCH Score                    Lactation Tools Discussed/Used    Interventions    Discharge    Consult Status Consult Status: Follow-up Date: 05/16/21 Follow-up type: In-patient    Kelechi Astarita R Gina Costilla 05/16/2021, 8:30 AM

## 2021-05-17 LAB — CBC
HCT: 29.3 % — ABNORMAL LOW (ref 36.0–46.0)
Hemoglobin: 8.9 g/dL — ABNORMAL LOW (ref 12.0–15.0)
MCH: 24.1 pg — ABNORMAL LOW (ref 26.0–34.0)
MCHC: 30.4 g/dL (ref 30.0–36.0)
MCV: 79.2 fL — ABNORMAL LOW (ref 80.0–100.0)
Platelets: 175 10*3/uL (ref 150–400)
RBC: 3.7 MIL/uL — ABNORMAL LOW (ref 3.87–5.11)
RDW: 14.3 % (ref 11.5–15.5)
WBC: 8 10*3/uL (ref 4.0–10.5)
nRBC: 0 % (ref 0.0–0.2)

## 2021-05-17 LAB — T.PALLIDUM AB, TOTAL: T Pallidum Abs: REACTIVE — AB

## 2021-05-17 NOTE — Discharge Summary (Signed)
Postpartum Discharge Summary  Date of Service updated 05/17/21     Patient Name: Jane Rivers DOB: 1991-04-25 MRN: [id number]  Date of admission: 05/15/2021 Delivery date:05/16/2021  Delivering provider: Holli Humbles B  Date of discharge: 05/17/2021  Admitting diagnosis: Normal labor and delivery [O80] Intrauterine pregnancy: [redacted]w[redacted]d    Secondary diagnosis:  Principal Problem:   Normal labor and delivery  Additional problems: History of previous cesarean section    Discharge diagnosis: Term Pregnancy Delivered and VBAC                                              Post partum procedures: none Augmentation: AROM and Pitocin Complications: None  Hospital course: Onset of Labor With Vaginal Delivery      30y.o. yo [id number] admitted in Active Labor on 05/15/2021. Patient had an uncomplicated labor course as follows:  Membrane Rupture Time/Date: 3:26 AM ,05/16/2021   Delivery Method:Vaginal, Spontaneous  Episiotomy: None  Lacerations:  Vaginal  Patient had an uncomplicated postpartum course.  She is ambulating, tolerating a regular diet, passing flatus, and urinating well. Patient is discharged home in stable condition on 05/17/21.  Newborn Data: Birth date:05/16/2021  Birth time:5:06 AM  Gender:Female  Living status:Living  Apgars:9 ,9  Weight:3010 g   Magnesium Sulfate received: No BMZ received: No Rhophylac:No MMR:No T-DaP:Given prenatally Flu: No Transfusion:No  Physical exam  Vitals:   05/16/21 1537 05/16/21 1935 05/17/21 0530 05/17/21 1328  BP: 114/70 122/68 114/73 117/74  Pulse: 67 65 84 98  Resp: 16 16 18 18   Temp: 98.1 F (36.7 C) 98 F (36.7 C) 98.5 F (36.9 C) 98.1 F (36.7 C)  TempSrc: Oral Oral Oral Oral  SpO2:  100% 100%   Weight:      Height:       General: alert, cooperative, and no distress Lochia: appropriate Uterine Fundus: Firm U-1 Incision: N/A DVT Evaluation: No evidence of DVT seen on physical  exam. Negative Homan's sign. No cords or calf tenderness. Labs: Lab Results  Component Value Date   WBC 8.0 05/17/2021   HGB 8.9 (L) 05/17/2021   HCT 29.3 (L) 05/17/2021   MCV 79.2 (L) 05/17/2021   PLT 175 05/17/2021   CMP Latest Ref Rng & Units 04/29/2013  Glucose 70 - 99 mg/dL 77  BUN 6 - 23 mg/dL 6  Creatinine 0.50 - 1.10 mg/dL 0.56  Sodium 135 - 145 mEq/L 131(L)  Potassium 3.5 - 5.1 mEq/L 3.8  Chloride 96 - 112 mEq/L 100  CO2 19 - 32 mEq/L 22  Calcium 8.4 - 10.5 mg/dL 9.3  Total Protein 6.0 - 8.3 g/dL -  Total Bilirubin 0.3 - 1.2 mg/dL -  Alkaline Phos 39 - 117 U/L -  AST 0 - 37 U/L -  ALT 0 - 35 U/L -   Edinburgh Score: Edinburgh Postnatal Depression Scale Screening Tool 05/17/2021  I have been able to laugh and see the funny side of things. 0  I have looked forward with enjoyment to things. 0  I have blamed myself unnecessarily when things went wrong. 1  I have been anxious or worried for no good reason. 0  I have felt scared or panicky for no good reason. 0  Things have been getting on top of me. 0  I have been so unhappy that I have had difficulty  sleeping. 0  I have felt sad or miserable. 0  I have been so unhappy that I have been crying. 0  The thought of harming myself has occurred to me. 0  Edinburgh Postnatal Depression Scale Total 1      After visit meds:  Allergies as of 05/17/2021       Reactions   Shellfish Allergy Nausea And Vomiting        Medication List     STOP taking these medications    valACYclovir 500 MG tablet Commonly known as: VALTREX       TAKE these medications    prenatal multivitamin Tabs tablet Take 1 tablet by mouth daily at 12 noon.         Discharge home in stable condition Infant Feeding: Bottle Infant Disposition:home with mother Discharge instruction: per After Visit Summary and Postpartum booklet. Activity: Advance as tolerated. Pelvic rest for 6 weeks.  Diet: routine diet Anticipated Birth  Control: Unsure Postpartum Appointment:6 weeks Additional Postpartum F/U:  none Future Appointments:No future appointments. Follow up Visit:      05/17/2021 Sanjuana Kava, MD

## 2021-05-19 ENCOUNTER — Encounter (HOSPITAL_COMMUNITY): Payer: Self-pay | Admitting: Obstetrics and Gynecology

## 2021-05-19 ENCOUNTER — Other Ambulatory Visit: Payer: Self-pay

## 2021-05-19 ENCOUNTER — Inpatient Hospital Stay (HOSPITAL_COMMUNITY)
Admission: AD | Admit: 2021-05-19 | Discharge: 2021-05-19 | Discharge status: Against Medical Advice | Payer: Medicaid Other | Attending: Obstetrics and Gynecology | Admitting: Obstetrics and Gynecology

## 2021-05-19 DIAGNOSIS — O864 Pyrexia of unknown origin following delivery: Secondary | ICD-10-CM | POA: Diagnosis present

## 2021-05-19 DIAGNOSIS — Z20822 Contact with and (suspected) exposure to covid-19: Secondary | ICD-10-CM | POA: Diagnosis not present

## 2021-05-19 DIAGNOSIS — R509 Fever, unspecified: Secondary | ICD-10-CM

## 2021-05-19 DIAGNOSIS — O9089 Other complications of the puerperium, not elsewhere classified: Secondary | ICD-10-CM

## 2021-05-19 LAB — URINALYSIS, ROUTINE W REFLEX MICROSCOPIC
Bilirubin Urine: NEGATIVE
Glucose, UA: NEGATIVE mg/dL
Ketones, ur: NEGATIVE mg/dL
Nitrite: NEGATIVE
Protein, ur: 100 mg/dL — AB
RBC / HPF: >50 RBC/hpf — ABNORMAL HIGH (ref 0–5)
Specific Gravity, Urine: >1.046 — ABNORMAL HIGH (ref 1.005–1.030)
WBC, UA: >50 WBC/hpf — ABNORMAL HIGH (ref 0–5)
pH: 5 (ref 5.0–8.0)

## 2021-05-19 LAB — CBC
HCT: 32.6 % — ABNORMAL LOW (ref 36.0–46.0)
Hemoglobin: 10.3 g/dL — ABNORMAL LOW (ref 12.0–15.0)
MCH: 24.5 pg — ABNORMAL LOW (ref 26.0–34.0)
MCHC: 31.6 g/dL (ref 30.0–36.0)
MCV: 77.6 fL — ABNORMAL LOW (ref 80.0–100.0)
Platelets: 189 10*3/uL (ref 150–400)
RBC: 4.2 MIL/uL (ref 3.87–5.11)
RDW: 14.6 % (ref 11.5–15.5)
WBC: 11.9 10*3/uL — ABNORMAL HIGH (ref 4.0–10.5)
nRBC: 0.3 % — ABNORMAL HIGH (ref 0.0–0.2)

## 2021-05-19 LAB — RESP PANEL BY RT-PCR (FLU A&B, COVID) ARPGX2
Influenza A by PCR: NEGATIVE
Influenza B by PCR: NEGATIVE
SARS Coronavirus 2 by RT PCR: NEGATIVE

## 2021-05-19 NOTE — MAU Note (Signed)
Presents stating she has fever & chills.  Reports temp 102.4 this morning @ 0800, took 2 Tylenol (regular strength @ 0930).  Also states she passed 2 large blood clots this morning, VB WNL.   S/P NVD 05/16/2021

## 2021-05-19 NOTE — MAU Note (Signed)
Patient stating she needs to leave. Risks reviewed and AMA form signed.

## 2021-05-19 NOTE — MAU Provider Note (Signed)
Chief Complaint:  Fever and Passed 2 Blood Clots   Event Date/Time   First Provider Initiated Contact with Patient 05/19/21 1214      HPI: Jane Rivers is a 30 y.o. G2P2002 who is 3 days PP following SVD who presents to maternity admissions reporting she passed a large clot at home today and had fever/chills.  She reports fever at home of 102. She took Tylenol around 2 hours prior to arrival in MAU.  Bleeding since she passed the clot has been light, requiring a pad but not soaking pads.  There is no n/v, and no other symptoms.  She denies URI symptoms or sick contacts.     HPI  Past Medical History: Past Medical History:  Diagnosis Date   Abnormal Pap smear    never had   Asthma    Chlamydia    HSV (herpes simplex virus) infection    Infection    chlamydia   Syphilis     Past obstetric history: OB History  Gravida Para Term Preterm AB Living  2 2 2     2   SAB IAB Ectopic Multiple Live Births        0 2    # Outcome Date GA Lbr Len/2nd Weight Sex Delivery Anes PTL Lv  2 Term 05/16/21 [redacted]w[redacted]d 01:28 / 01:58 3010 g F Vag-Spont EPI  LIV     Birth Comments: wnl  1 Term 07/23/13 [redacted]w[redacted]d  3680 g F CS-LTranv Spinal  LIV    Past Surgical History: Past Surgical History:  Procedure Laterality Date   CESAREAN SECTION N/A 07/23/2013   Procedure: CESAREAN SECTION;  Surgeon: Delice Lesch, MD;  Location: [facility];  Service: Obstetrics;  Laterality: N/A;   NO PAST SURGERIES      Family History: Family History  Problem Relation Age of Onset   Hypertension Maternal Aunt    Hypertension Maternal Grandmother    Hypertension Maternal Grandfather     Social History: Social History   Tobacco Use   Smoking status: Never   Smokeless tobacco: Never  Vaping Use   Vaping Use: Never used  Substance Use Topics   Alcohol use: No   Drug use: Not Currently    Types: Marijuana    Allergies:  Allergies  Allergen Reactions   Shellfish Allergy Nausea And Vomiting    Meds:   Medications Prior to Admission  Medication Sig Dispense Refill Last Dose   acetaminophen (TYLENOL) 650 MG CR tablet Take 650 mg by mouth every 8 (eight) hours as needed for pain.   05/19/2021 at 1000   Prenatal Vit-Fe Fumarate-FA (PRENATAL MULTIVITAMIN) TABS tablet Take 1 tablet by mouth daily at 12 noon.       ROS:  Review of Systems  Constitutional:  Positive for chills and fever. Negative for fatigue.  Respiratory:  Negative for shortness of breath.   Cardiovascular:  Negative for chest pain.  Gastrointestinal:  Negative for abdominal pain.  Genitourinary:  Positive for vaginal bleeding. Negative for difficulty urinating, dysuria, flank pain, pelvic pain, vaginal discharge and vaginal pain.  Neurological:  Negative for dizziness and headaches.  Psychiatric/Behavioral: Negative.      I have reviewed patient's Past Medical Hx, Surgical Hx, Family Hx, Social Hx, medications and allergies.   Physical Exam  Patient Vitals for the past 24 hrs:  BP Temp Temp src Pulse Resp SpO2 Height Weight  05/19/21 1105 110/72 -- -- (!) 111 -- -- -- --  05/19/21 1052 116/70 98.9 F (37.2 C)  Oral (!) 121 19 98 % -- --  05/19/21 1046 -- -- -- -- -- -- 5\' 2"  (1.575 m) 88.8 kg   Constitutional: Well-developed, well-nourished female in no acute distress.  Cardiovascular: normal rate Respiratory: normal effort GI: Abd soft, non-tender, no rebound tenderness, gravid appropriate for gestational age.  MS: Extremities nontender, no edema, normal ROM Neurologic: Alert and oriented x 4.  GU: Neg CVAT.   Bimanual exam: No uterine tenderness to palpation        Labs: Results for orders placed or performed during the hospital encounter of 05/19/21 (from the past 24 hour(s))  Resp Panel by RT-PCR (Flu A&B, Covid) Nasopharyngeal Swab     Status: None   Collection Time: 05/19/21 11:28 AM   Specimen: Nasopharyngeal Swab; Nasopharyngeal(NP) swabs in vial transport medium  Result Value Ref Range   SARS  Coronavirus 2 by RT PCR NEGATIVE NEGATIVE   Influenza A by PCR NEGATIVE NEGATIVE   Influenza B by PCR NEGATIVE NEGATIVE  Urinalysis, Routine w reflex microscopic     Status: Abnormal   Collection Time: 05/19/21 12:10 PM  Result Value Ref Range   Color, Urine AMBER (A) YELLOW   APPearance HAZY (A) CLEAR   Specific Gravity, Urine >1.046 (H) 1.005 - 1.030   pH 5.0 5.0 - 8.0   Glucose, UA NEGATIVE NEGATIVE mg/dL   Hgb urine dipstick LARGE (A) NEGATIVE   Bilirubin Urine NEGATIVE NEGATIVE   Ketones, ur NEGATIVE NEGATIVE mg/dL   Protein, ur 100 (A) NEGATIVE mg/dL   Nitrite NEGATIVE NEGATIVE   Leukocytes,Ua MODERATE (A) NEGATIVE   RBC / HPF >50 (H) 0 - 5 RBC/hpf   WBC, UA >50 (H) 0 - 5 WBC/hpf   Bacteria, UA RARE (A) NONE SEEN   Squamous Epithelial / LPF 0-5 0 - 5   Mucus PRESENT   CBC     Status: Abnormal   Collection Time: 05/19/21 12:12 PM  Result Value Ref Range   WBC 11.9 (H) 4.0 - 10.5 K/uL   RBC 4.20 3.87 - 5.11 MIL/uL   Hemoglobin 10.3 (L) 12.0 - 15.0 g/dL   HCT 32.6 (L) 36.0 - 46.0 %   MCV 77.6 (L) 80.0 - 100.0 fL   MCH 24.5 (L) 26.0 - 34.0 pg   MCHC 31.6 30.0 - 36.0 g/dL   RDW 14.6 11.5 - 15.5 %   Platelets 189 150 - 400 K/uL   nRBC 0.3 (H) 0.0 - 0.2 %   --/--/O POS (11/21 0012)  Imaging:   MAU Course/MDM: Orders Placed This Encounter  Procedures   Resp Panel by RT-PCR (Flu A&B, Covid) Nasopharyngeal Swab   Urinalysis, Routine w reflex microscopic   CBC    No orders of the defined types were placed in this encounter.    Pt left AMA before provider presented results. No evidence of UTI, no uterine tenderness or acute abdomen, and no fever in MAU.     Assessment: 1. Postpartum complication   2. Fever and chills     Plan: Pt left AMA    Fatima Blank Certified Nurse-Midwife 05/19/2021 1:01 PM

## 2021-05-28 ENCOUNTER — Telehealth (HOSPITAL_COMMUNITY): Payer: Self-pay | Admitting: *Deleted

## 2021-05-28 NOTE — Telephone Encounter (Signed)
Hospital Discharge Follow-Up Call:  Patient reports that she is well and has no concerns about her healing process.  EPDS today was 0 and she endorses this accurately reflects that she is doing well emotionally.  Patient says that baby is well and she has no concerns about baby's health.  She reports that baby sleeps in a bassinet.  Reviewed ABCs of Safe Sleep.

## 2021-06-01 DIAGNOSIS — Z8619 Personal history of other infectious and parasitic diseases: Secondary | ICD-10-CM | POA: Insufficient documentation

## 2021-06-02 ENCOUNTER — Ambulatory Visit (INDEPENDENT_AMBULATORY_CARE_PROVIDER_SITE_OTHER): Payer: Medicaid Other | Admitting: Internal Medicine

## 2021-06-02 ENCOUNTER — Encounter: Payer: Self-pay | Admitting: Internal Medicine

## 2021-06-02 ENCOUNTER — Other Ambulatory Visit: Payer: Self-pay

## 2021-06-02 DIAGNOSIS — Z8619 Personal history of other infectious and parasitic diseases: Secondary | ICD-10-CM

## 2021-06-02 NOTE — Progress Notes (Signed)
[facility] for Infectious [facility]  Reason for Consult: History of secondary syphilis Referring Provider: Dr. Sanjuana Kava  Assessment: She had secondary syphilis in 2019 that was treated appropriately with 1 dose of benzathine penicillin.  Her rash resolved promptly.  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  Her most recent RPR titer was still positive but had decreased fourfold indicating a good response to treatment.  In some people it may take years for the RPR to return to nonreactive and in a small subset of people it will remain positive (so-called "serofast status").  She does not need any further treatment at this time and does not need repeat testing unless there she has future pregnancies or shows signs and symptoms of reinfection.  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   PRENATAL VIT-FE FUMARATE-FA (PRENATAL MULTIVITAMIN) TABS TABLET    Take 1 tablet by mouth daily at 12 noon.  Modified Medications   No medications on file  Discontinued Medications   No medications on file    HPI: Jane Rivers is a 30 y.o. female who developed a macular rash on her  palms and soles in the summer 2019.  She was seen at her local [facility] urgent care on 02/20/2018.  An RPR was performed and returned positive at a titer of 1:16.  She was called back in and treated with benzathine penicillin 2,400,000 units IM x1 on 02/25/2018.  Her rash resolved shortly after treatment.  She remained concerned about syphilis and went to the local health department about 6 months later.  She does not recall any further testing being done but she was treated again with benzathine penicillin 2,400,000 units IM weekly x3 doses.  She has not had any signs or symptoms of persistent or recurrent infection since that time.  She was hospitalized on 05/16/2021 in labor and delivered a healthy baby girl via normal spontaneous vaginal delivery.  Repeat RPR at that time was positive at a titer of 1:2.  Her confirmatory Treponema pallidum antibody was again reactive.  She is currently not married but in a stable, mutually monogamous relationship with a female partner.  She says that he was tested for syphilis recently and tested negative.  Review of Systems: Review of Systems  Constitutional:  Negative for fever.  Genitourinary:        No genital lesions.  Skin:  Negative for rash.     Past Medical History:  Diagnosis Date   Abnormal Pap smear    never had   Asthma    Chlamydia    HSV (herpes simplex virus) infection  Infection    chlamydia   Syphilis     Social History   Tobacco Use   Smoking status: Never   Smokeless tobacco: Never  Vaping Use   Vaping Use: Never used  Substance Use Topics   Alcohol use: No   Drug use: Not Currently    Types: Marijuana    Family History  Problem Relation Age of Onset   Hypertension Maternal Aunt    Hypertension Maternal Grandmother    Hypertension Maternal Grandfather    Allergies  Allergen Reactions   Shellfish Allergy Nausea And Vomiting    OBJECTIVE: There were no vitals filed for this visit. There is no height or weight on  file to calculate BMI.   Physical Exam Constitutional:      Comments: She is calm and pleasant.  Eyes:     Conjunctiva/sclera: Conjunctivae normal.     Pupils: Pupils are equal, round, and reactive to light.  Cardiovascular:     Rate and Rhythm: Regular rhythm. Tachycardia present.     Heart sounds: No murmur heard. Pulmonary:     Effort: Pulmonary effort is normal.     Breath sounds: Normal breath sounds.  Abdominal:     Palpations: Abdomen is soft.     Tenderness: There is no abdominal tenderness.  Musculoskeletal:     Cervical back: Neck supple.  Skin:    Findings: No rash.  Neurological:     General: No focal deficit present.     Gait: Gait normal.  Psychiatric:        Mood and Affect: Mood normal.    Microbiology: No results found for this or any previous visit (from the past 240 hour(s)).  Michel Bickers, MD [facility] for [facility] [phone] pager   [phone] cell 06/02/2021, 8:45 AM

## 2022-02-23 ENCOUNTER — Encounter (HOSPITAL_COMMUNITY): Payer: Self-pay | Admitting: Emergency Medicine

## 2022-02-23 ENCOUNTER — Ambulatory Visit (HOSPITAL_COMMUNITY)
Admission: EM | Admit: 2022-02-23 | Discharge: 2022-02-23 | Disposition: A | Discharge status: Home / Self Care | Payer: Medicaid Other | Attending: Emergency Medicine | Admitting: Emergency Medicine

## 2022-02-23 DIAGNOSIS — S61213A Laceration without foreign body of left middle finger without damage to nail, initial encounter: Secondary | ICD-10-CM | POA: Diagnosis not present

## 2022-02-23 NOTE — ED Provider Notes (Signed)
[facility]    CSN: [id number] Arrival date & time: 02/23/22  1611     History   Chief Complaint Chief Complaint  Patient presents with   Laceration    HPI Emorie Mcfate is a 31 y.o. female.  Cut finger on broken metal last night. Left middle finger. Reports 5/10 pain Bleeding mostly resolved, reports on and off Has not tried any medications for pain  Reports unknown last tetanus. On chart review Tdap given prenatally last year.  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   History of chlamydia 06/01/2021   Normal labor and delivery 05/16/2021   S/P primary low transverse C-section 07/23/2013   PLANTAR FASCIITIS 08/27/2009   CONTACT DERMATITIS 06/16/2009   IRON DEFICIENCY 11/20/2007   PICA 11/20/2007   ALLERGIC RHINITIS 10/11/2007   ASTHMA 10/11/2007   SEBORRHEIC DERMATITIS 10/11/2007   UNSPECIFIED ALOPECIA 10/11/2007    Past Surgical History:  Procedure Laterality Date   CESAREAN SECTION N/A 07/23/2013   Procedure: CESAREAN SECTION;  Surgeon: Delice Lesch, MD;  Location: [facility];  Service: Obstetrics;  Laterality: N/A;   NO PAST SURGERIES      OB History     Gravida  2   Para  2   Term  2   Preterm      AB      Living  2      SAB      IAB      Ectopic      Multiple  0   Live Births  2            Home Medications    Prior to Admission medications   Medication Sig Start Date End Date Taking? Authorizing Provider  acetaminophen (TYLENOL) 650 MG CR tablet Take 650 mg by mouth every 8 (eight) hours as needed for pain. Patient not taking: Reported on 06/02/2021    [provider]  Prenatal Vit-Fe Fumarate-FA (PRENATAL MULTIVITAMIN) TABS tablet Take 1 tablet by mouth daily at  12 noon. Patient not taking: Reported on 06/02/2021    [provider]    Family History Family History  Problem Relation Age of Onset   Hypertension Maternal Aunt    Hypertension Maternal Grandmother    Hypertension Maternal Grandfather     Social History Social History   Tobacco Use   Smoking status: Never   Smokeless tobacco: Never  Vaping Use   Vaping Use: Never used  Substance Use Topics   Alcohol use: No   Drug use: Not Currently    Types: Marijuana     Allergies   Shellfish allergy   Review of Systems Review of Systems Per HPI  Physical Exam Triage Vital Signs ED Triage Vitals  Enc Vitals Group     BP 02/23/22 1630 118/71     Pulse Rate 02/23/22 1630 90     Resp 02/23/22 1630 16     Temp 02/23/22 1630 98.3 F (36.8 C)     Temp Source 02/23/22 1630 Oral     SpO2 02/23/22 1630 98 %     Weight --      Height --  Head Circumference --      Peak Flow --      Pain Score 02/23/22 1628 5     Pain Loc --      Pain Edu? --      Excl. in [place]? --    No data found.  Updated Vital Signs BP 118/71 (BP Location: Right Arm)   Pulse 90   Temp 98.3 F (36.8 C) (Oral)   Resp 16   SpO2 98%    Physical Exam Musculoskeletal:     Comments: Full ROM at finger DIP and PIP. Cap refill < 2 sec  Skin:    Findings: Laceration present.     Comments: 1 cm lac to ventral left hand middle finger between DIP and PIP joints. Horizontal. Not well approximated. Not bleeding     UC Treatments / Results  Labs (all labs ordered are listed, but only abnormal results are displayed) Labs Reviewed - No data to display  EKG   Radiology No results found.  Procedures Laceration Repair  Date/Time: 02/23/2022 5:09 PM  Performed by: Les Pou, PA-C Authorized by: Les Pou, PA-C   Consent:    Consent obtained:  Verbal   Consent given by:  Patient Universal protocol:    Patient identity confirmed:  Verbally with patient Anesthesia:     Anesthesia method:  None Laceration details:    Location:  Finger   Finger location:  L long finger   Length (cm):  1 Exploration:    Wound exploration: wound explored through full range of motion and entire depth of wound visualized   Treatment:    Area cleansed with:  Chlorhexidine and soap and water   Amount of cleaning:  Standard   Irrigation solution:  Sterile saline Skin repair:    Repair method:  Steri-Strips   Number of Steri-Strips:  3 Approximation:    Approximation:  Close Repair type:    Repair type:  Simple Post-procedure details:    Dressing:  Splint for protection and non-adherent dressing   Procedure completion:  Tolerated   Medications Ordered in UC Medications - No data to display  Initial Impression / Assessment and Plan / UC Course  I have reviewed the triage vital signs and the nursing notes.  Pertinent labs & imaging results that were available during my care of the patient were reviewed by me and considered in my medical decision making (see chart for details).  Lac occurred last night, out of window for primary closure. Closed with steri-strips, edges came together well. Splinted finger to avoid bending and popping the strips. Recommend leave strips on for a day of two. Return precautions discussed. Patient agrees to plan  Final Clinical Impressions(s) / UC Diagnoses   Final diagnoses:  Laceration of left middle finger without foreign body without damage to nail, initial encounter     Discharge Instructions      Please keep the steri-strips on for the next day. Keep the finger bandage on to help keep your finger straight. Bending may cause pain and bleeding.  You can take ibuprofen or tylenol for pain.  Return to urgent are with any concerns.      ED Prescriptions   None    PDMP not reviewed this encounter.   Reita Shindler, [place], [place] 02/23/22 1712

## 2022-02-23 NOTE — Discharge Instructions (Signed)
Please keep the steri-strips on for the next day. Keep the finger bandage on to help keep your finger straight. Bending may cause pain and bleeding.  You can take ibuprofen or tylenol for pain.  Return to urgent are with any concerns.

## 2022-02-23 NOTE — ED Triage Notes (Signed)
Pt reports metal dust pan broke up last night causing a cut to left middle finger. Reports will bleed off and on still. Unsure when last tetanus

## 2022-08-16 LAB — HM PAP SMEAR

## 2023-02-14 IMAGING — US US ABDOMEN COMPLETE
1 series · 15 of 25 positions shown · non-contrast
Comparison: None.

CLINICAL DATA: Right upper quadrant pain.  27 weeks pregnant.

EXAM:
ABDOMEN ULTRASOUND COMPLETE

[Series 1: us abdomen complete · 15 of 83 slices shown]
[im 1/83]
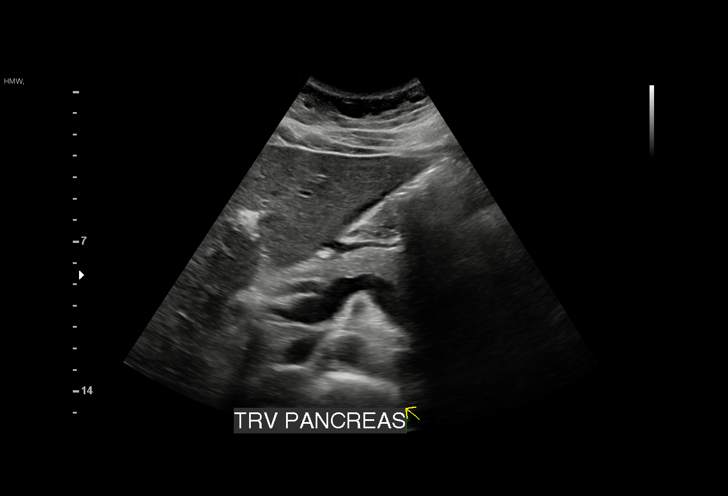
[im 7/83]
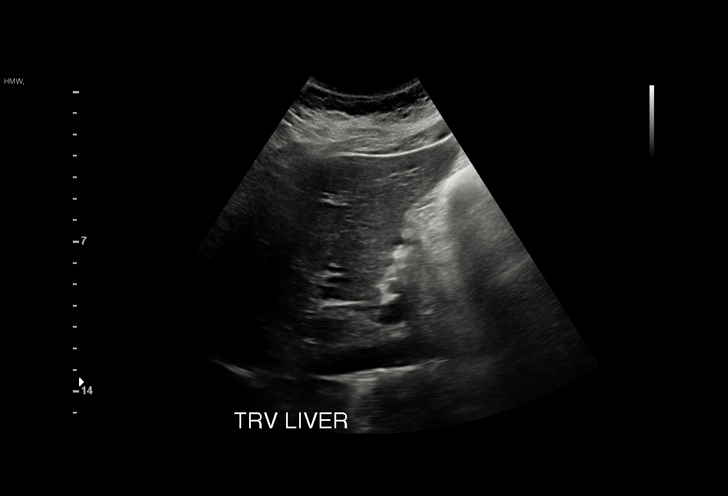
[im 14/83]
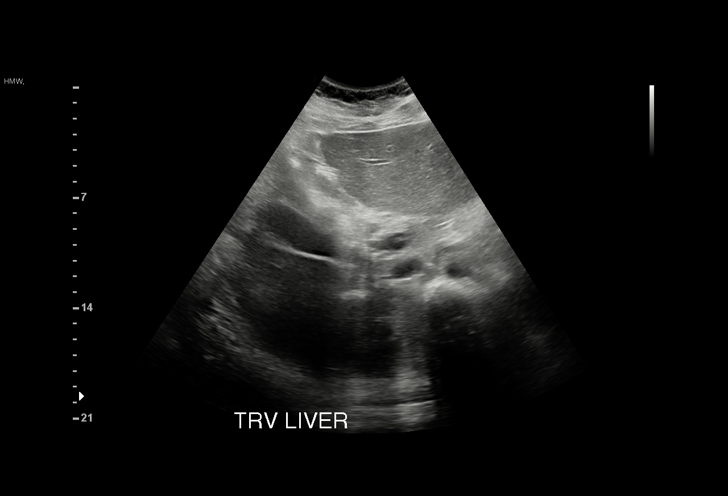
[im 18/83]
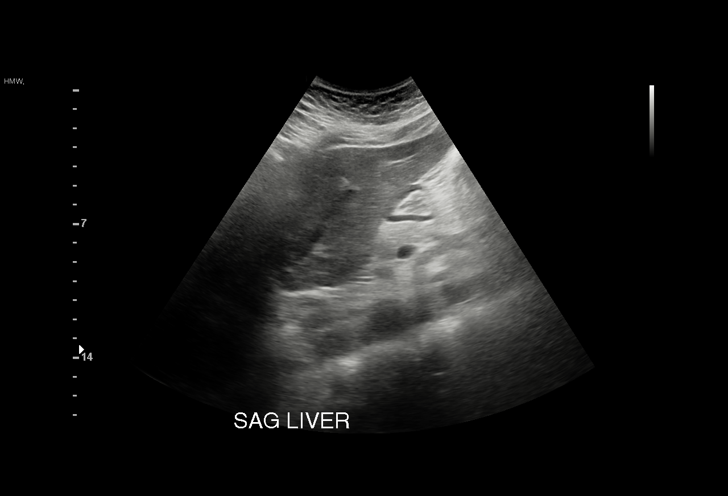
[im 24/83]
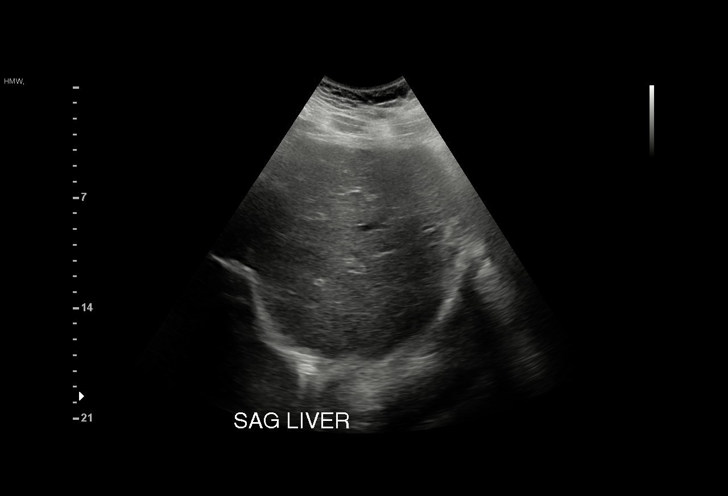
[im 31/83]
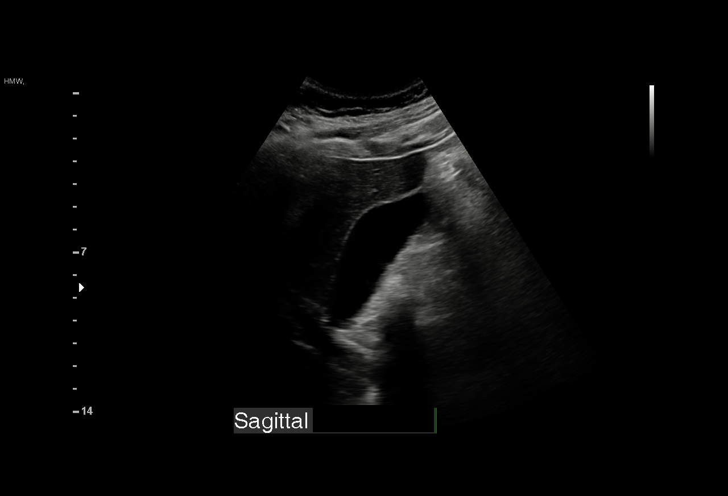
[im 35/83]
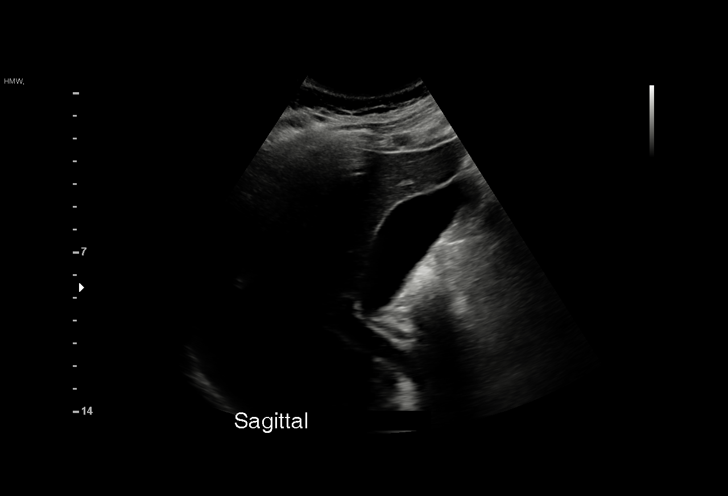
[im 42/83]
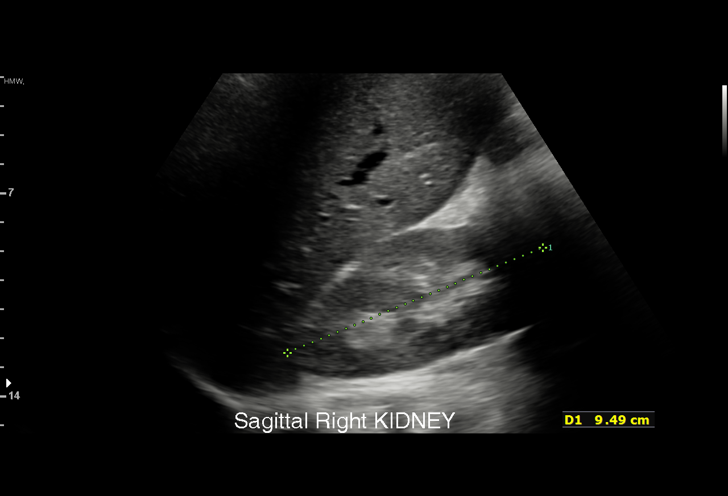
[im 48/83]
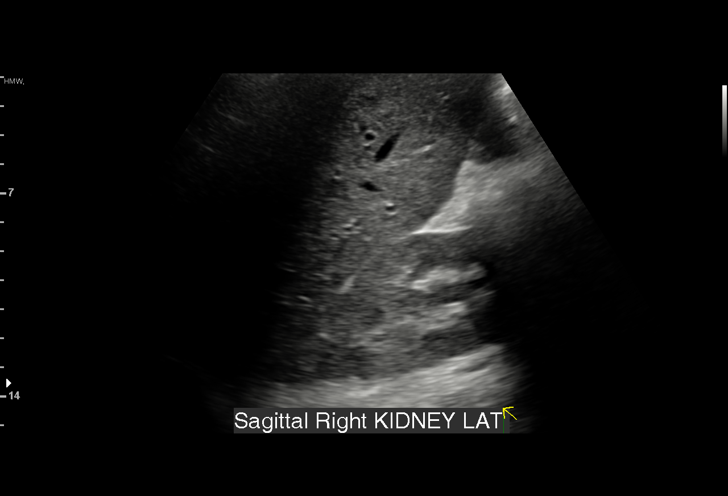
[im 52/83]
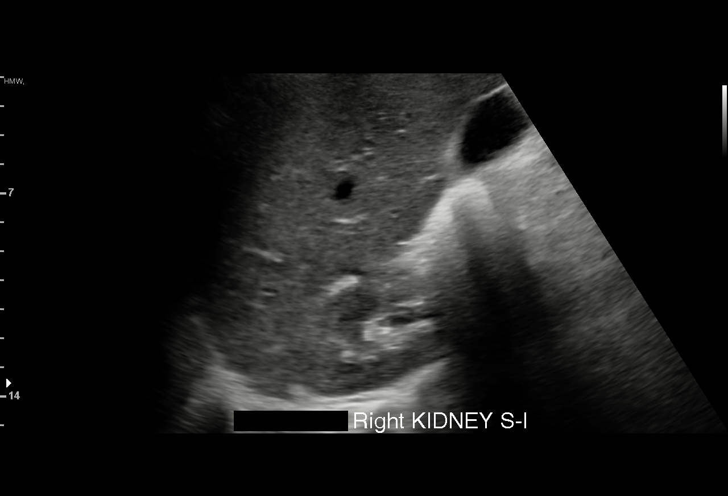
[im 59/83]
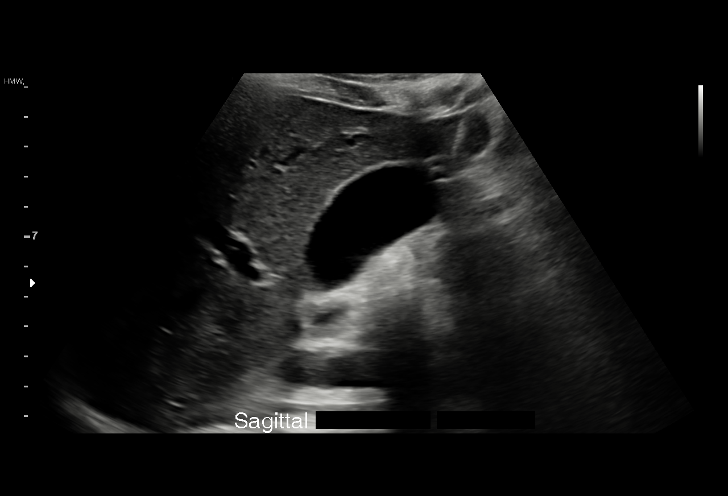
[im 65/83]
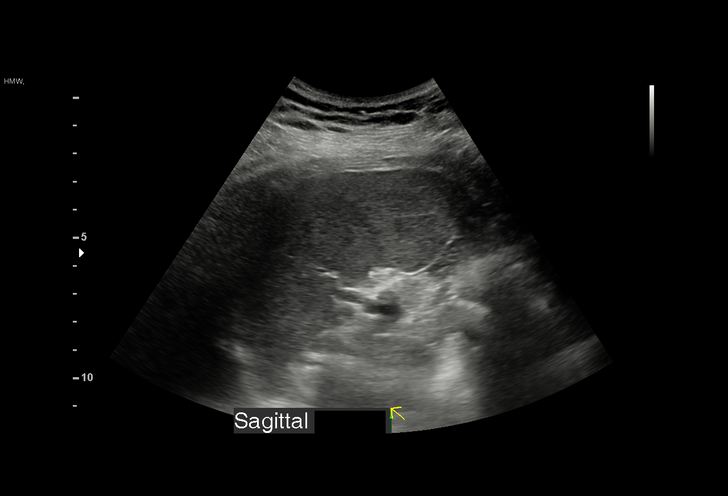
[im 69/83]
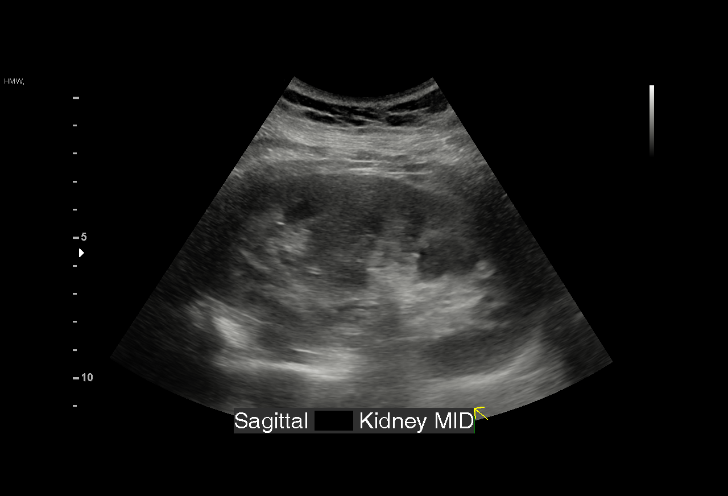
[im 76/83]
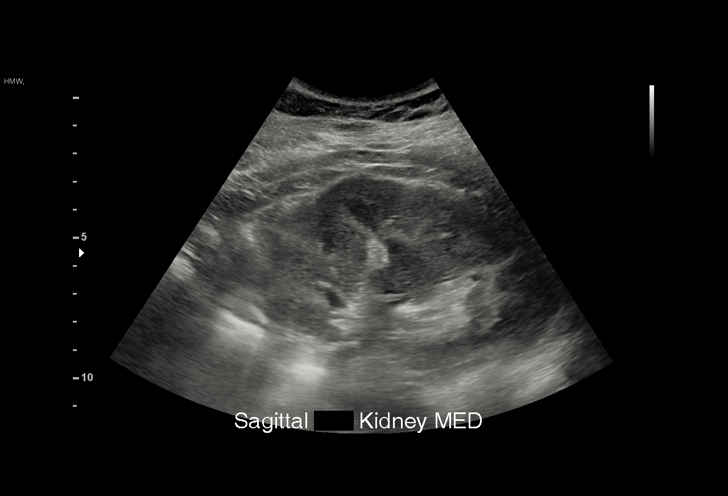
[im 83/83]
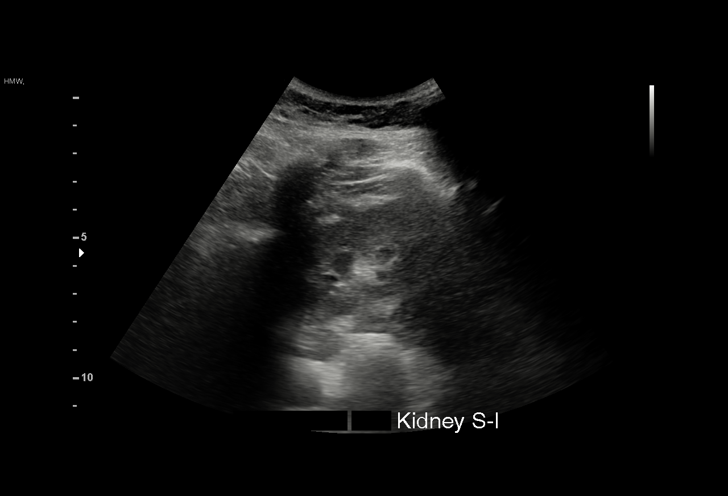

[15 of 25 positions shown; findings below may reference images not displayed]

FINDINGS: Gallbladder: No gallstones or wall thickening visualized. No
sonographic Murphy sign noted by sonographer.

Common bile duct: Diameter: 3 mm

Liver: No focal lesion identified. Within normal limits in
parenchymal echogenicity. Portal vein is patent on color Doppler
imaging with normal direction of blood flow towards the liver.

IVC: No abnormality visualized.

Pancreas: Visualized portion unremarkable.

Spleen: Size and appearance within normal limits.

Right Kidney: Length: 9.5 cm. Echogenicity within normal limits. No
mass or hydronephrosis visualized.

Left Kidney: Length: 12.0 cm. Echogenicity within normal limits. No
mass or hydronephrosis visualized.

Abdominal aorta: No aneurysm visualized.

Other findings: None.
IMPRESSION: Unremarkable exam. No findings to explain the patient's history of
pain.

## 2023-05-15 ENCOUNTER — Ambulatory Visit: Payer: Medicaid Other | Admitting: Obstetrics & Gynecology

## 2023-05-15 ENCOUNTER — Other Ambulatory Visit: Payer: Self-pay

## 2023-05-15 ENCOUNTER — Encounter: Payer: Self-pay | Admitting: Obstetrics & Gynecology

## 2023-05-15 VITALS — BP 95/62 | HR 67 | Ht 62.0 in | Wt 203.0 lb

## 2023-05-15 DIAGNOSIS — D259 Leiomyoma of uterus, unspecified: Secondary | ICD-10-CM | POA: Diagnosis not present

## 2023-05-15 DIAGNOSIS — D509 Iron deficiency anemia, unspecified: Secondary | ICD-10-CM | POA: Diagnosis not present

## 2023-05-15 MED ORDER — FERROUS SULFATE 325 (65 FE) MG PO TABS: 325.0000 mg | ORAL_TABLET | ORAL | 3 refills | Status: AC

## 2023-05-15 NOTE — Progress Notes (Signed)
32 y.o. New GYN presents for Fibroids??  Last PAP 08/16/22 NILM @ [facility]

## 2023-05-15 NOTE — Progress Notes (Signed)
Patient ID: Jane Rivers, female   DOB: 11/22/1990, 32 y.o.   MRN: [id number]  Chief Complaint  Patient presents with   New Patient (Initial Visit)   Gynecologic Exam  H/o fibroids and she wishes to have f/u  HPI Jane Rivers is a 32 y.o. female.  [id number] Patient's last menstrual period was 05/14/2023 (exact date). Currently she is using no BCM. Fibroids were diagnosed during her last pregnancy in 2022. Her menses are generally regular and light, no pain. HPI  Past Medical History:  Diagnosis Date   Abnormal Pap smear    never had   Anemia    Asthma    Chlamydia    Fibroid    HSV (herpes simplex virus) infection    Infection    chlamydia   Syphilis     Past Surgical History:  Procedure Laterality Date   CESAREAN SECTION N/A 07/23/2013   Procedure: CESAREAN SECTION;  Surgeon: Purcell Nails, MD;  Location: [facility];  Service: Obstetrics;  Laterality: N/A;   NO PAST SURGERIES      Family History  Problem Relation Age of Onset   Hypertension Maternal Aunt    Hypertension Maternal Grandmother    Hypertension Maternal Grandfather     Social History Social History   Tobacco Use   Smoking status: Never   Smokeless tobacco: Never  Vaping Use   Vaping status: Never Used  Substance Use Topics   Alcohol use: No   Drug use: Not Currently    Types: Marijuana    Allergies  Allergen Reactions   Shellfish Allergy Nausea And Vomiting    Current Outpatient Medications  Medication Sig Dispense Refill   ferrous sulfate 325 (65 FE) MG tablet Take 1 tablet (325 mg total) by mouth every other day. 30 tablet 3   acetaminophen (TYLENOL) 650 MG CR tablet Take 650 mg by mouth every 8 (eight) hours as needed for pain. (Patient not taking: Reported on 06/02/2021)     Prenatal Vit-Fe Fumarate-FA (PRENATAL MULTIVITAMIN) TABS tablet Take 1 tablet by mouth daily at 12 noon. (Patient not taking: Reported on 06/02/2021)     No current facility-administered medications for this visit.     Review of Systems Review of Systems  Blood pressure 95/62, pulse 67, height 5\' 2"  (1.575 m), weight 203 lb (92.1 kg), last menstrual period 05/14/2023, unknown if currently breastfeeding.  Physical Exam Physical Exam Vitals and nursing note reviewed.  Constitutional:      Appearance: Normal appearance.  HENT:     Head: Normocephalic and atraumatic.  Cardiovascular:     Rate and Rhythm: Normal rate.  Pulmonary:     Effort: Pulmonary effort is normal.  Abdominal:     General: Abdomen is flat.     Palpations: Abdomen is soft. There is no mass.  Skin:    General: Skin is warm and dry.  Neurological:     Mental Status: She is alert.  Psychiatric:        Mood and Affect: Mood normal.        Behavior: Behavior normal.     Data Reviewed Narrative & Impression CLINICAL DATA:  Vaginal bleeding   EXAM: OBSTETRIC <14 WK ULTRASOUND   TECHNIQUE: Transabdominal ultrasound was performed for evaluation of the gestation as well as the maternal uterus and adnexal regions.   COMPARISON:  None.   FINDINGS: Intrauterine gestational sac: Visualized-single   Yolk sac:  Visualized   Embryo:  Visualized   Cardiac Activity: Visualized   Heart Rate:  171 bpm   CRL:   33 mm   10 w 1 d                  [place] EDC: May 15, 2021   Subchorionic hemorrhage:  None visualized.   Maternal uterus/adnexae: Within the uterus, there are decreased echogenicity masses consistent with leiomyomas measuring 4.2 x 3.4 x 4.0 cm and 3.7 x 3.2 x 3.4 cm respectively.   Left ovary measures 3.2 x 2.3 x 2.4 cm. Right ovary not well seen. No extrauterine pelvic masses evident beyond physiologic corpus luteum on the left. No free pelvic fluid.   IMPRESSION: Single live intrauterine gestation with estimated gestational age of [redacted] weeks. Intrauterine leiomyomas noted. No subchorionic hemorrhage.   Right ovary not well seen.  Left ovary appears unremarkable.     Electronically Signed   By: Bretta Bang III M.D.   On: 10/18/2020 08:47   CBC    Component Value Date/Time   WBC 11.9 (H) 05/19/2021 1212   RBC 4.20 05/19/2021 1212   HGB 10.3 (L) 05/19/2021 1212   HGB 10.7 (L) 03/30/2010 1339   HCT 32.6 (L) 05/19/2021 1212   HCT 34.5 (L) 03/30/2010 1339   PLT 189 05/19/2021 1212   PLT 300 03/30/2010 1339   MCV 77.6 (L) 05/19/2021 1212   MCV 74.8 (L) 03/30/2010 1339   MCH 24.5 (L) 05/19/2021 1212   MCHC 31.6 05/19/2021 1212   RDW 14.6 05/19/2021 1212   RDW 14.4 03/30/2010 1339   LYMPHSABS 1.4 04/29/2013 1855   LYMPHSABS 1.7 03/30/2010 1339   MONOABS 0.6 04/29/2013 1855   MONOABS 0.5 03/30/2010 1339   EOSABS 0.3 04/29/2013 1855   EOSABS 0.3 03/30/2010 1339   BASOSABS 0.0 04/29/2013 1855   BASOSABS 0.0 03/30/2010 1339   Component Ref Range & Units 4 mo ago  IRON BIND. CAP.(TIBC) 250 - 450 ug/dL 191  UIBC 478 - 295 ug/dL 621  IRON, SERUM 27 - 308 ug/dL 19 Low   IRON SATURATION 15 - 55 % 4 Low Panic   FERRITIN, SERUM 15 - 150 ng/mL 6 Low   WHITE BLOOD CELL(WBC) COUNT 3.4 - 10.8 x10E3/uL 3.7  RED BLOOD CELL (RBC) COUNT 3.77 - 5.28 x10E6/uL 4.58  HEMOGLOBIN 11.1 - 15.9 g/dL 9.4 Low   HEMATOCRIT 65.7 - 46.6 % 32.1 Low   MCV 79 - 97 fL 70 Low   MCH 26.6 - 33.0 pg 20.5 Low   MCHC 31.5 - 35.7 g/dL 84.6 Low   RDW 96.2 - 95.2 % 15.3  PLATELETS 150 - 450 x10E3/uL 292  NEUTROPHILS Not Estab. % 42  LYMPHS Not Estab. % 38  MONOCYTES Not Estab. % 11  EOS Not Estab. % 8  BASOPHILS Not Estab. % 1  NEUTROPHILS (ABSOLUTE) 1.4 - 7.0 x10E3/uL 1.6  LYMPHS (ABSOLUTE) 0.7 - 3.1 x10E3/uL 1.4  MONOCYTES(ABSOLUTE) 0.1 - 0.9 x10E3/uL 0.4  EOS (ABSOLUTE) 0.0 - 0.4 x10E3/uL 0.3  BASO (ABSOLUTE) 0.0 - 0.2 x10E3/uL 0  IMMATURE GRANULOCYTES Not Estab. % 0  IMMATURE GRANS (ABS) 0.0 - 0.1 x10E3/uL 0  RETICULOCYTE COUNT 0.6 - 2.6 % 1.8  Resulting Agency [facility]   Specimen Collected: 01/15/23 10:05   Performed by: Verdell Carmine Last Resulted: 01/16/23 06:36   Received From: [facility]      Assessment Uterine leiomyoma, unspecified location - Plan: US PELVIC COMPLETE WITH TRANSVAGINAL, ferrous sulfate 325 (65 FE) MG tablet  Iron deficiency anemia, unspecified iron deficiency anemia type   Plan Monitor fibroids  with pelvic US Fe for anemia    Scheryl Darter 05/15/2023, 1:50 PM

## 2023-05-18 ENCOUNTER — Ambulatory Visit (HOSPITAL_BASED_OUTPATIENT_CLINIC_OR_DEPARTMENT_OTHER): Payer: Medicaid Other

## 2023-05-21 ENCOUNTER — Ambulatory Visit (HOSPITAL_BASED_OUTPATIENT_CLINIC_OR_DEPARTMENT_OTHER)
Admission: RE | Admit: 2023-05-21 | Discharge: 2023-05-21 | Disposition: A | Discharge status: Home / Self Care | Payer: Medicaid Other | Source: Ambulatory Visit | Attending: Obstetrics & Gynecology | Admitting: Obstetrics & Gynecology

## 2023-05-21 DIAGNOSIS — D259 Leiomyoma of uterus, unspecified: Secondary | ICD-10-CM
